# Patient Record
Sex: Male | Born: 1955 | Race: Black or African American | Hispanic: No | Marital: Married | Smoking: Never smoker
Health system: Southern US, Community
[De-identification: ages and names within clinical notes are randomized; demographics above are authoritative.]

## PROBLEM LIST (undated history)

## (undated) DIAGNOSIS — N400 Enlarged prostate without lower urinary tract symptoms: Secondary | ICD-10-CM

## (undated) DIAGNOSIS — M1990 Unspecified osteoarthritis, unspecified site: Secondary | ICD-10-CM

## (undated) DIAGNOSIS — I10 Essential (primary) hypertension: Secondary | ICD-10-CM

## (undated) DIAGNOSIS — M549 Dorsalgia, unspecified: Secondary | ICD-10-CM

## (undated) HISTORY — PX: BACK SURGERY: SHX140

---

## 2001-07-23 ENCOUNTER — Ambulatory Visit (HOSPITAL_COMMUNITY): Admission: RE | Admit: 2001-07-23 | Discharge: 2001-07-23 | Payer: Self-pay | Admitting: Family Medicine

## 2001-07-23 ENCOUNTER — Encounter: Payer: Self-pay | Admitting: Family Medicine

## 2001-07-31 ENCOUNTER — Ambulatory Visit (HOSPITAL_COMMUNITY): Admission: RE | Admit: 2001-07-31 | Discharge: 2001-07-31 | Payer: Self-pay | Admitting: General Surgery

## 2003-01-03 ENCOUNTER — Ambulatory Visit (HOSPITAL_COMMUNITY): Admission: RE | Admit: 2003-01-03 | Discharge: 2003-01-03 | Payer: Self-pay | Admitting: Family Medicine

## 2003-01-03 ENCOUNTER — Encounter: Payer: Self-pay | Admitting: Family Medicine

## 2007-08-26 ENCOUNTER — Ambulatory Visit (HOSPITAL_COMMUNITY): Admission: RE | Admit: 2007-08-26 | Discharge: 2007-08-26 | Payer: Self-pay | Admitting: Family Medicine

## 2010-07-27 NOTE — H&P (Signed)
[facility]  Patient:    OVIE, CORNELIO Visit Number: [id number] MRN: [id number]          Service Type: OUT Location: RAD Attending Physician:  Patrica Duel Dictated by:   Franky Macho, M.D. Admit Date:  07/23/2001 Discharge Date: 07/23/2001   CC:         Patrica Duel, M.D.   History and Physical  DATE OF BIRTH:  05-Aug-1955  CHIEF COMPLAINT:  Hematochezia.  HISTORY OF PRESENT ILLNESS:  The patient is a 55 year old black male who was referred for evaluation and treatment of hematochezia.  He started turning the toilet bowl red with blood per rectum one month ago.  It was intermittent in nature.  No abdominal pain, weight loss, cramping, melena, constipation, or diarrhea had been noted.  PAST MEDICAL HISTORY:  Hypertension, GERD.  PAST SURGICAL HISTORY:  Colonoscopy in 1999 which revealed occasional diverticula, and internal hemorrhoidal disease.  CURRENT MEDICATIONS:  Bisoprolol, hydrochlorothiazide one tablet a day, Skelaxin, Protonix, hydrocortisone cream for his rectum.  ALLERGIES:  No known drug allergies.  REVIEW OF SYSTEMS:  Unremarkable.  PHYSICAL EXAMINATION:  GENERAL:  The patient is a well-developed and well-nourished black male in no acute distress.  VITAL SIGNS:  He is afebrile and vital signs are stable.  LUNGS:  Clear to auscultation with equal breath sounds bilaterally.  HEART:  Examination reveals a regular rate and rhythm without S3, S4, or murmurs.  ABDOMEN:  Benign.  RECTAL:  Examination was deferred until the procedure.  IMPRESSION:  Hematochezia.  PLAN:  The patient was scheduled for a colonoscopy on Jul 31, 2001.  The risks and benefits of the procedure including bleeding and perforation were fully explained to the patient and he gave informed consent. Dictated by:   Franky Macho, M.D. Attending Physician:  Patrica Duel DD:  07/21/01 TD:  07/22/01 Job: [id number]

## 2012-12-15 ENCOUNTER — Ambulatory Visit: Payer: Self-pay | Admitting: Urology

## 2012-12-22 ENCOUNTER — Encounter (INDEPENDENT_AMBULATORY_CARE_PROVIDER_SITE_OTHER): Payer: Self-pay

## 2012-12-22 ENCOUNTER — Ambulatory Visit (INDEPENDENT_AMBULATORY_CARE_PROVIDER_SITE_OTHER): Payer: BC Managed Care – PPO | Admitting: Urology

## 2012-12-22 DIAGNOSIS — R3129 Other microscopic hematuria: Secondary | ICD-10-CM

## 2012-12-22 DIAGNOSIS — N400 Enlarged prostate without lower urinary tract symptoms: Secondary | ICD-10-CM

## 2013-02-16 ENCOUNTER — Encounter (INDEPENDENT_AMBULATORY_CARE_PROVIDER_SITE_OTHER): Payer: Self-pay | Admitting: *Deleted

## 2013-08-20 ENCOUNTER — Encounter (INDEPENDENT_AMBULATORY_CARE_PROVIDER_SITE_OTHER): Payer: Self-pay | Admitting: *Deleted

## 2013-09-20 ENCOUNTER — Encounter (INDEPENDENT_AMBULATORY_CARE_PROVIDER_SITE_OTHER): Payer: Self-pay | Admitting: *Deleted

## 2013-09-20 ENCOUNTER — Other Ambulatory Visit (INDEPENDENT_AMBULATORY_CARE_PROVIDER_SITE_OTHER): Payer: Self-pay | Admitting: *Deleted

## 2013-09-20 DIAGNOSIS — Z1211 Encounter for screening for malignant neoplasm of colon: Secondary | ICD-10-CM

## 2013-10-11 ENCOUNTER — Telehealth (INDEPENDENT_AMBULATORY_CARE_PROVIDER_SITE_OTHER): Payer: Self-pay | Admitting: *Deleted

## 2013-10-11 DIAGNOSIS — Z1211 Encounter for screening for malignant neoplasm of colon: Secondary | ICD-10-CM

## 2013-10-11 NOTE — Telephone Encounter (Signed)
Patient needs movi prep 

## 2013-10-13 MED ORDER — PEG-KCL-NACL-NASULF-NA ASC-C 100 G PO SOLR: 1.0000 | Freq: Once | ORAL | Status: DC

## 2013-11-05 ENCOUNTER — Telehealth (INDEPENDENT_AMBULATORY_CARE_PROVIDER_SITE_OTHER): Payer: Self-pay | Admitting: *Deleted

## 2013-11-05 NOTE — Telephone Encounter (Signed)
  Procedure: tcs  Reason/Indication:  screening  Has patient had this procedure before?  Yes, 12 years ago  If so, when, by whom and where?    Is there a family history of colon cancer?  no  Who?  What age when diagnosed?    Is patient diabetic?   no      Does patient have prosthetic heart valve?  no  Do you have a pacemaker?  nono  Has patient ever had endocarditis? no  Has patient had joint replacement within last 12 months?  no  Does patient tend to be constipated or take laxatives? no  Is patient on Coumadin, Plavix and/or Aspirin? no  Medications: lisinopril/hctz 20/12.5 mg daily  Allergies: nkda  Medication Adjustment:   Procedure date & time: 12/02/13 at 1030

## 2013-11-05 NOTE — Telephone Encounter (Signed)
agree

## 2013-11-16 ENCOUNTER — Encounter (HOSPITAL_COMMUNITY): Payer: Self-pay | Admitting: Pharmacy Technician

## 2013-12-02 ENCOUNTER — Encounter (HOSPITAL_COMMUNITY): Payer: Self-pay | Admitting: *Deleted

## 2013-12-02 ENCOUNTER — Encounter (HOSPITAL_COMMUNITY)
Admission: RE | Disposition: A | Discharge status: Home / Self Care | Payer: Self-pay | Source: Ambulatory Visit | Attending: Internal Medicine

## 2013-12-02 ENCOUNTER — Ambulatory Visit (HOSPITAL_COMMUNITY)
Admission: RE | Admit: 2013-12-02 | Discharge: 2013-12-02 | Disposition: A | Discharge status: Home / Self Care | Payer: BC Managed Care – PPO | Source: Ambulatory Visit | Attending: Internal Medicine | Admitting: Internal Medicine

## 2013-12-02 DIAGNOSIS — K644 Residual hemorrhoidal skin tags: Secondary | ICD-10-CM | POA: Insufficient documentation

## 2013-12-02 DIAGNOSIS — Z79899 Other long term (current) drug therapy: Secondary | ICD-10-CM | POA: Insufficient documentation

## 2013-12-02 DIAGNOSIS — K5730 Diverticulosis of large intestine without perforation or abscess without bleeding: Secondary | ICD-10-CM | POA: Diagnosis not present

## 2013-12-02 DIAGNOSIS — Z1211 Encounter for screening for malignant neoplasm of colon: Secondary | ICD-10-CM | POA: Diagnosis present

## 2013-12-02 DIAGNOSIS — I10 Essential (primary) hypertension: Secondary | ICD-10-CM | POA: Diagnosis not present

## 2013-12-02 DIAGNOSIS — D126 Benign neoplasm of colon, unspecified: Secondary | ICD-10-CM | POA: Diagnosis not present

## 2013-12-02 DIAGNOSIS — K645 Perianal venous thrombosis: Secondary | ICD-10-CM | POA: Diagnosis not present

## 2013-12-02 HISTORY — DX: Unspecified osteoarthritis, unspecified site: M19.90

## 2013-12-02 HISTORY — DX: Benign prostatic hyperplasia without lower urinary tract symptoms: N40.0

## 2013-12-02 HISTORY — DX: Dorsalgia, unspecified: M54.9

## 2013-12-02 HISTORY — DX: Essential (primary) hypertension: I10

## 2013-12-02 HISTORY — PX: COLONOSCOPY: SHX5424

## 2013-12-02 SURGERY — COLONOSCOPY
Anesthesia: Moderate Sedation

## 2013-12-02 MED ORDER — SODIUM CHLORIDE 0.9 % IV SOLN
INTRAVENOUS | Status: DC
  Administered 2013-12-02: 10:00:00 via INTRAVENOUS

## 2013-12-02 MED ORDER — MIDAZOLAM HCL 5 MG/5ML IJ SOLN
INTRAMUSCULAR | Status: DC | PRN
  Administered 2013-12-02 (×2): 2 mg via INTRAVENOUS

## 2013-12-02 MED ORDER — MEPERIDINE HCL 50 MG/ML IJ SOLN
INTRAMUSCULAR | Status: AC
  Filled 2013-12-02: qty 1

## 2013-12-02 MED ORDER — STERILE WATER FOR IRRIGATION IR SOLN
Status: DC | PRN
  Administered 2013-12-02: 11:00:00

## 2013-12-02 MED ORDER — MIDAZOLAM HCL 5 MG/5ML IJ SOLN
INTRAMUSCULAR | Status: AC
  Filled 2013-12-02: qty 10

## 2013-12-02 MED ORDER — MEPERIDINE HCL 50 MG/ML IJ SOLN
INTRAMUSCULAR | Status: DC | PRN
  Administered 2013-12-02 (×2): 25 mg via INTRAVENOUS

## 2013-12-02 NOTE — Op Note (Signed)
[place], [address]   COLONOSCOPY PROCEDURE REPORT  PATIENT: Ethan, Bentley  MR#: [id number] BIRTHDATE: 1956/03/06 , 31  yrs. old GENDER: male ENDOSCOPIST: Hildred Laser, MD REFERRED BY:  Kerin Perna, M.D. PROCEDURE DATE:  12/02/2013 PROCEDURE:   Colonoscopy, screening ASA CLASS:   Class I INDICATIONS:average risk for colorectal cancer. MEDICATIONS: Cetacaine spray for oral pharyngeal topical anesthesia, Meperidine (Demerol) 50 mg IV, and Versed 4 mg IV  DESCRIPTION OF PROCEDURE:   After the risks and benefits and of the procedure were explained, informed consent was obtained.  revealed a normal rectal and perianal exam.    The [id number] ([id number]) endoscope was introduced through the anus and advanced to the cecum, which was identified by both the appendix and ileocecal valve .  The quality of the prep was adequate. .  The instrument was then slowly withdrawn as the colon was fully examined.     COLON FINDINGS: There was mild diverticulosis noted at the hepatic flexure and in the sigmoid colon.   Two polyps ranging between 3-81mm in size were found in the right colon and at the cecum. Multiple biopsies were performed.  A polypectomy was performed with a cold snare.  The resection was complete, the polyp tissue was completely retrieved and sent to histology.     Retroflexed views revealed internal thrombosed hemorrhoids.     The scope was then withdrawn from the patient and the procedure completed.  WITHDRAWAL TIME: 16 minutes 0 seconds  COMPLICATIONS: There were no complications. ENDOSCOPIC IMPRESSION: 1.   There was mild diverticulosis noted at the hepatic flexure and in the sigmoid colon 2.   Two polyps ranging between 3-42mm in size were found in the right colon and at the cecum; multiple biopsies were performed.these polyps were submitted together. 3.   5 mm polyp cold snare from sigmoid colon. 4.   external  hemorrhoids. RECOMMENDATIONS: Await biopsy results  REPEAT EXAM:  cc:  _______________________________ Lorrin MaisHildred Laser, MD 12/02/2013 11:38 AM     PATIENT NAME:  Ethan, Bentley MR#: [id number]

## 2013-12-02 NOTE — Discharge Instructions (Addendum)
Resume usual medications and high fiber diet. °No driving for 24 hours. °Physician will call with biopsy results. ° °Colonoscopy, Care After °Refer to this sheet in the next few weeks. These instructions provide you with information on caring for yourself after your procedure. Your health care provider may also give you more specific instructions. Your treatment has been planned according to current medical practices, but problems sometimes occur. Call your health care provider if you have any problems or questions after your procedure. °WHAT TO EXPECT AFTER THE PROCEDURE  °After your procedure, it is typical to have the following: °· A small amount of blood in your stool. °· Moderate amounts of gas and mild abdominal cramping or bloating. °HOME CARE INSTRUCTIONS °· Do not drive, operate machinery, or sign important documents for 24 hours. °· You may shower and resume your regular physical activities, but move at a slower pace for the first 24 hours. °· Take frequent rest periods for the first 24 hours. °· Walk around or put a warm pack on your abdomen to help reduce abdominal cramping and bloating. °· Drink enough fluids to keep your urine clear or pale yellow. °· You may resume your normal diet as instructed by your health care provider. Avoid heavy or fried foods that are hard to digest. °· Avoid drinking alcohol for 24 hours or as instructed by your health care provider. °· Only take over-the-counter or prescription medicines as directed by your health care provider. °· If a tissue sample (biopsy) was taken during your procedure: °· Do not take aspirin or blood thinners for 7 days, or as instructed by your health care provider. °· Do not drink alcohol for 7 days, or as instructed by your health care provider. °· Eat soft foods for the first 24 hours. °SEEK MEDICAL CARE IF: °You have persistent spotting of blood in your stool 2-3 days after the procedure. °SEEK IMMEDIATE MEDICAL CARE IF: °· You have more than a  small spotting of blood in your stool. °· You pass large blood clots in your stool. °· Your abdomen is swollen (distended). °· You have nausea or vomiting. °· You have a fever. °· You have increasing abdominal pain that is not relieved with medicine. °Document Released: 10/10/2003 Document Revised: 12/16/2012 Document Reviewed: 11/02/2012 °ExitCare® Patient Information ©2015 [employer]. This information is not intended to replace advice given to you by your health care provider. Make sure you discuss any questions you have with your health care provider. ° °High-Fiber Diet °Fiber is found in fruits, vegetables, and grains. A high-fiber diet encourages the addition of more whole grains, legumes, fruits, and vegetables in your diet. The recommended amount of fiber for adult males is 38 g per day. For adult females, it is 25 g per day. Pregnant and lactating women should get 28 g of fiber per day. If you have a digestive or bowel problem, ask your caregiver for advice before adding high-fiber foods to your diet. Eat a variety of high-fiber foods instead of only a select few type of foods.  °PURPOSE °· To increase stool bulk. °· To make bowel movements more regular to prevent constipation. °· To lower cholesterol. °· To prevent overeating. °WHEN IS THIS DIET USED? °· It may be used if you have constipation and hemorrhoids. °· It may be used if you have uncomplicated diverticulosis (intestine condition) and irritable bowel syndrome. °· It may be used if you need help with weight management. °· It may be used if you want to   add it to your diet as a protective measure against atherosclerosis, diabetes, and cancer. °SOURCES OF FIBER °· Whole-grain breads and cereals. °· Fruits, such as apples, oranges, bananas, berries, prunes, and pears. °· Vegetables, such as green peas, carrots, sweet potatoes, beets, broccoli, cabbage, spinach, and artichokes. °· Legumes, such split peas, soy, lentils. °· Almonds. °FIBER CONTENT IN  FOODS °Starches and Grains / Dietary Fiber (g) °· Cheerios, 1 cup / 3 g °· Corn Flakes cereal, 1 cup / 0.7 g °· Rice crispy treat cereal, 1¼ cup / 0.3 g °· Instant oatmeal (cooked), ½ cup / 2 g °· Frosted wheat cereal, 1 cup / 5.1 g °· Brown, long-grain rice (cooked), 1 cup / 3.5 g °· White, long-grain rice (cooked), 1 cup / 0.6 g °· Enriched macaroni (cooked), 1 cup / 2.5 g °Legumes / Dietary Fiber (g) °· Baked beans (canned, plain, or vegetarian), ½ cup / 5.2 g °· Kidney beans (canned), ½ cup / 6.8 g °· Pinto beans (cooked), ½ cup / 5.5 g °Breads and Crackers / Dietary Fiber (g) °· Plain or honey graham crackers, 2 squares / 0.7 g °· Saltine crackers, 3 squares / 0.3 g °· Plain, salted pretzels, 10 pieces / 1.8 g °· Whole-wheat bread, 1 slice / 1.9 g °· White bread, 1 slice / 0.7 g °· Raisin bread, 1 slice / 1.2 g °· Plain bagel, 3 oz / 2 g °· Flour tortilla, 1 oz / 0.9 g °· Corn tortilla, 1 small / 1.5 g °· Hamburger or hotdog bun, 1 small / 0.9 g °Fruits / Dietary Fiber (g) °· Apple with skin, 1 medium / 4.4 g °· Sweetened applesauce, ½ cup / 1.5 g °· Banana, ½ medium / 1.5 g °· Grapes, 10 grapes / 0.4 g °· Orange, 1 small / 2.3 g °· Raisin, 1.5 oz / 1.6 g °· Melon, 1 cup / 1.4 g °Vegetables / Dietary Fiber (g) °· Green beans (canned), ½ cup / 1.3 g °· Carrots (cooked), ½ cup / 2.3 g °· Broccoli (cooked), ½ cup / 2.8 g °· Peas (cooked), ½ cup / 4.4 g °· Mashed potatoes, ½ cup / 1.6 g °· Lettuce, 1 cup / 0.5 g °· Corn (canned), ½ cup / 1.6 g °· Tomato, ½ cup / 1.1 g °Document Released: 02/25/2005 Document Revised: 08/27/2011 Document Reviewed: 05/30/2011 °ExitCare® Patient Information ©2015 [employer]. This information is not intended to replace advice given to you by your health care provider. Make sure you discuss any questions you have with your health care provider. ° °

## 2013-12-02 NOTE — H&P (Signed)
Ethan Bentley is an 58 y.o. male.   Chief Complaint: Patient is here for colonoscopy. HPI: Patient is 58 year old [place] American male who is here for screening colonoscopy. He denies abdominal pain change in his bowel habits or rectal bleeding. Last colonoscopy was 5 years ago.  Family history is negative for [facility]  Past Medical History  Diagnosis Date  . Hypertension   . Arthritis   . Prostate enlargement   . Back pain     History reviewed. No pertinent past surgical history.  Family History  Problem Relation Age of Onset  . Cancer Mother   . Stroke Father    Social History:  reports that he has never smoked. He has never used smokeless tobacco. He reports that he drinks about 1.8 ounces of alcohol per week. He reports that he does not use illicit drugs.  Allergies: No Known Allergies  Medications Prior to Admission  Medication Sig Dispense Refill  . cholecalciferol (VITAMIN D) 1000 UNITS tablet Take 1,000 Units by mouth daily.      Marland Kitchen lisinopril-hydrochlorothiazide (PRINZIDE,ZESTORETIC) 20-12.5 MG per tablet Take 1 tablet by mouth daily.      . Omega-3 Fatty Acids (FISH OIL) 1000 MG CAPS Take 1 capsule by mouth daily.      . peg 3350 powder (MOVIPREP) 100 G SOLR Take 1 kit (200 g total) by mouth once.  1 kit  0  . vitamin E 1000 UNIT capsule Take 1,000 Units by mouth daily.        No results found for this or any previous visit (from the past 48 hour(s)). No results found.  ROS  Blood pressure 121/81, pulse 56, temperature 97.7 F (36.5 C), temperature source Oral, resp. rate 16, SpO2 100.00%. Physical Exam  Constitutional: He appears well-developed and well-nourished.  HENT:  Mouth/Throat: Oropharynx is clear and moist.  Eyes: Conjunctivae are normal. No scleral icterus.  Neck: No thyromegaly present.  Cardiovascular: Normal rate, regular rhythm and normal heart sounds.   No murmur heard. Respiratory: Effort normal and breath sounds normal.  GI: Soft. He  exhibits no distension and no mass. There is no tenderness.  Musculoskeletal: He exhibits no edema.  Lymphadenopathy:    He has no cervical adenopathy.  Neurological: He is alert.  Skin: Skin is warm and dry.     Assessment/Plan Average risk screening colonoscopy.  REHMAN,NAJEEB U 12/02/2013, 10:54 AM

## 2013-12-03 ENCOUNTER — Encounter (HOSPITAL_COMMUNITY): Payer: Self-pay | Admitting: Internal Medicine

## 2014-04-12 ENCOUNTER — Emergency Department (HOSPITAL_COMMUNITY): Payer: BLUE CROSS/BLUE SHIELD

## 2014-04-12 ENCOUNTER — Other Ambulatory Visit (HOSPITAL_COMMUNITY): Payer: Self-pay | Admitting: Family Medicine

## 2014-04-12 ENCOUNTER — Encounter (HOSPITAL_COMMUNITY): Payer: Self-pay | Admitting: Emergency Medicine

## 2014-04-12 ENCOUNTER — Emergency Department (HOSPITAL_COMMUNITY)
Admission: EM | Admit: 2014-04-12 | Discharge: 2014-04-12 | Disposition: A | Discharge status: Home / Self Care | Payer: BLUE CROSS/BLUE SHIELD | Attending: Emergency Medicine | Admitting: Emergency Medicine

## 2014-04-12 DIAGNOSIS — M5416 Radiculopathy, lumbar region: Secondary | ICD-10-CM | POA: Diagnosis not present

## 2014-04-12 DIAGNOSIS — I10 Essential (primary) hypertension: Secondary | ICD-10-CM | POA: Insufficient documentation

## 2014-04-12 DIAGNOSIS — M1990 Unspecified osteoarthritis, unspecified site: Secondary | ICD-10-CM | POA: Diagnosis not present

## 2014-04-12 DIAGNOSIS — Z79899 Other long term (current) drug therapy: Secondary | ICD-10-CM | POA: Insufficient documentation

## 2014-04-12 DIAGNOSIS — Z87448 Personal history of other diseases of urinary system: Secondary | ICD-10-CM | POA: Insufficient documentation

## 2014-04-12 DIAGNOSIS — M79605 Pain in left leg: Secondary | ICD-10-CM | POA: Diagnosis present

## 2014-04-12 MED ORDER — OXYCODONE-ACETAMINOPHEN 5-325 MG PO TABS: 1.0000 | ORAL_TABLET | ORAL | Status: DC | PRN

## 2014-04-12 MED ORDER — HYDROMORPHONE HCL 1 MG/ML IJ SOLN
1.0000 mg | Freq: Once | INTRAMUSCULAR | Status: AC
  Administered 2014-04-12: 1 mg via INTRAMUSCULAR
  Filled 2014-04-12: qty 1

## 2014-04-12 MED ORDER — HYDROMORPHONE HCL 1 MG/ML IJ SOLN
1.0000 mg | Freq: Once | INTRAMUSCULAR | Status: AC
Start: 2014-04-12 — End: 2014-04-12
  Administered 2014-04-12: 1 mg via INTRAMUSCULAR
  Filled 2014-04-12: qty 1

## 2014-04-12 NOTE — ED Notes (Addendum)
Patient called stating he spoke with MD about a refill prescription for Lisinopril-HCTZ 20-12.5 mg. MD consulted and verbal order to call in prescription for Lisinopril-HCTZ 20-12.5 mg Tab OTY:30 with 2 refills. RN Called prescription to [facility] in [place] per patient request.

## 2014-04-12 NOTE — ED Notes (Signed)
Patient with no complaints at this time. Respirations even and unlabored. Skin warm/dry. Discharge instructions reviewed with patient at this time. Patient given opportunity to voice concerns/ask questions. Patient discharged at this time and left Emergency Department with steady gait.   

## 2014-04-12 NOTE — ED Provider Notes (Signed)
CSN: [id number]     Arrival date & time 04/12/14  1008 History  This chart was scribed for Nat Christen, MD by Lowella Petties, ED Scribe. The patient was seen in room APA15/APA15. Patient's care was started at 10:44 AM.     Chief Complaint  Patient presents with  . Leg Pain   The history is provided by the patient. No language interpreter was used.   HPI Comments: BUCK MCAFFEE is a 59 y.o. male with a history of HTN who presents to the Emergency Department complaining of constant, aching, gradually worsening, pain in his left lower back that radiates down his medial and lateral thigh and into his anterior left shin and left foot for the past 10 days. He states that the pain began after he shoveled snow on 04/02/14. He reports difficulty standing and walking due to the pain. He reports taking ibuprofen with minimal relief. He was seen for this at a primary care facility and prescribed a steroid dose pack. He reports that he had an MRI of his back in the past which showed some degenerative changes. He denies a history of back surgery.   Past Medical History  Diagnosis Date  . Hypertension   . Arthritis   . Prostate enlargement   . Back pain    Past Surgical History  Procedure Laterality Date  . Colonoscopy N/A 12/02/2013    Procedure: COLONOSCOPY;  Surgeon: Rogene Houston, MD;  Location: [facility];  Service: Endoscopy;  Laterality: N/A;  1030   Family History  Problem Relation Age of Onset  . Cancer Mother   . Stroke Father    History  Substance Use Topics  . Smoking status: Never Smoker   . Smokeless tobacco: Never Used  . Alcohol Use: 1.8 oz/week    3 Cans of beer per week    Review of Systems  Constitutional: Negative for fever and chills.  Musculoskeletal: Positive for back pain.   A complete 10 system review of systems was obtained and all systems are negative except as noted in the HPI and PMH.   Allergies  Review of patient's allergies indicates no known  allergies.  Home Medications   Prior to Admission medications   Medication Sig Start Date End Date Taking? Authorizing Provider  ibuprofen (ADVIL,MOTRIN) 800 MG tablet Take 800 mg by mouth 3 (three) times daily. 04/08/14  Yes Historical Provider, MD  lisinopril-hydrochlorothiazide (PRINZIDE,ZESTORETIC) 20-12.5 MG per tablet Take 1 tablet by mouth daily.   Yes Historical Provider, MD  Omega-3 Fatty Acids (FISH OIL) 1000 MG CAPS Take 1 capsule by mouth daily.   Yes Historical Provider, MD  predniSONE (STERAPRED UNI-PAK) 10 MG tablet Take 1 tablet by mouth as directed. 04/08/14  Yes Historical Provider, MD  vitamin E 1000 UNIT capsule Take 1,000 Units by mouth daily.   Yes Historical Provider, MD  oxyCODONE-acetaminophen (PERCOCET) 5-325 MG per tablet Take 1-2 tablets by mouth every 4 (four) hours as needed. 04/12/14   Nat Christen, MD   Triage Vitals: BP 160/93 mmHg  Pulse 62  Temp(Src) 98 F (36.7 C) (Oral)  Resp 18  Ht 5\' 6"  (1.676 m)  Wt 185 lb (83.915 kg)  BMI 29.87 kg/m2  SpO2 98% Physical Exam  Constitutional: He is oriented to person, place, and time. He appears well-developed and well-nourished.  HENT:  Head: Normocephalic and atraumatic.  Eyes: Conjunctivae and EOM are normal. Pupils are equal, round, and reactive to light.  Neck: Normal range of motion.  Neck supple.  Cardiovascular: Normal rate, regular rhythm and normal heart sounds.   No murmur heard. Pulmonary/Chest: Effort normal and breath sounds normal. No respiratory distress. He has no wheezes. He has no rales. He exhibits no tenderness.  Abdominal: Soft. Bowel sounds are normal.  Musculoskeletal: Normal range of motion.  Lower lumbar spinal tenderness. Medial and lateral thigh and anterior tibia.   Neurological: He is alert and oriented to person, place, and time.  Skin: Skin is warm and dry.  Psychiatric: He has a normal mood and affect. His behavior is normal.  Nursing note and vitals reviewed.   ED Course   Procedures (including critical care time) DIAGNOSTIC STUDIES: Oxygen Saturation is 98% on room air, normal by my interpretation.    COORDINATION OF CARE: 10:51 AM-Discussed treatment plan which includes pain medication and lumbar spine MRI with pt at bedside and pt agreed to plan.   No results found for this or any previous visit. Mr Lumbar Spine Wo Contrast  04/12/2014   CLINICAL DATA:  Severe low back and left leg pain since the shoveling snow 1 week ago.  EXAM: MRI LUMBAR SPINE WITHOUT CONTRAST  TECHNIQUE: Multiplanar, multisequence MR imaging of the lumbar spine was performed. No intravenous contrast was administered.  COMPARISON:  None.  FINDINGS: Vertebral body height is maintained. There is trace anterolisthesis L4 on L5 due to facet arthropathy. Degenerative endplate signal change is seen at L5-S1. Is no worrisome marrow lesion. Convex right scoliosis is noted. The conus medullaris is normal in signal and position. Imaged intra-abdominal contents are unremarkable.  T11-12 level is imaged in the sagittal plane only and negative.  T12-L1:  Negative.  L1-2: There is a shallow disc bulge without central canal or foraminal stenosis.  L2-3: The patient has a shallow disc bulge with a superimposed left paracentral protrusion. The protrusion encroaches on the descending left L3 root and deforms left aspect of the thecal sac. Marked left foraminal narrowing is present due to disc and facet arthropathy. The right foramen appears open.  L3-4: The patient has a broad-based disc bulge with a superimposed left foraminal and extra foraminal protrusion. Moderate central canal stenosis is present with narrowing in the lateral recesses, worse on the left. Marked left foraminal narrowing is identified. The right foramen is open  L4-5: The patient has a disc bulge with a superimposed right paracentral and lateral recess protrusion. There is encroachment on the descending right L5 root in the lateral recess. Moderately  severe to severe bilateral foraminal narrowing is also present and worse on the right.  L5-S1: There is an annular fissure and shallow left paracentral protrusion. The thecal sac is widely patent. Mild narrowing is seen in the left lateral recess. Moderate to moderately severe foraminal narrowing is worse on the left.  IMPRESSION: Left paracentral protrusion at L2-3 encroaches on the descending left L3 root in the lateral recess and deforms the left aspect of the thecal sac. There is also marked left foraminal narrowing at this level due to disc and facet degenerative disease with encroachment on the exiting left L2 root.  Severe left foraminal narrowing L3-4 due to disc and facet arthropathy with encroachment on the exiting left L3 root. There is moderate central canal narrowing overall at this level.  A right lateral recess protrusion at L4-5 appears small but encroaches on the descending right L5 root. Moderate to moderately severe bilateral foraminal narrowing is also present at this level.  Disc and facet degenerative disease cause moderate to moderately  severe foraminal narrowing at L5-S1, worse on the left   Electronically Signed   By: Inge Rise M.D.   On: 04/12/2014 12:42      EKG Interpretation None      MDM   Final diagnoses:  Lumbar back pain with radiculopathy affecting left lower extremity   Patient presents with low back pain with radicular symptoms to the left. MRI scan shows left herniated nucleus pulposus at L2-3 encroaching on the nerve root. Discussed with Dr. Granville Lewis.  He will see patient on Thursday, February 4. Continue prednisone. Add Percocet for pain. Discussed with patient and his nephew.  I personally performed the services described in this documentation, which was scribed in my presence. The recorded information has been reviewed and is accurate.     Nat Christen, MD 04/12/14 [phone]

## 2014-04-12 NOTE — ED Notes (Addendum)
PT stated he was shoveling snow on 04/02/14 and started having lower back pain and went to the MD on 04/08/14 was prescribed prednisone, pain medicine and ibuprofen that day for sciatic nerve pain. PT stated pain was relived until he went back to work yesterday and stated aching again. PT denies any urinary symptoms.

## 2014-04-12 NOTE — Discharge Instructions (Signed)
Continue prednisone. Prescription for pain medication. Alternate cold and heat. Appointment with neurosurgeon on Thursday, February 4 11:30. Information given and discharge instructions. He will need to bring a photo ID, your insurance card, a co-pay. If you have any questions call [phone]

## 2014-07-28 ENCOUNTER — Emergency Department (HOSPITAL_COMMUNITY)
Admission: EM | Admit: 2014-07-28 | Discharge: 2014-07-29 | Disposition: A | Discharge status: Home / Self Care | Payer: BLUE CROSS/BLUE SHIELD | Attending: Emergency Medicine | Admitting: Emergency Medicine

## 2014-07-28 ENCOUNTER — Encounter (HOSPITAL_COMMUNITY): Payer: Self-pay | Admitting: *Deleted

## 2014-07-28 ENCOUNTER — Emergency Department (HOSPITAL_COMMUNITY): Payer: BLUE CROSS/BLUE SHIELD

## 2014-07-28 DIAGNOSIS — I10 Essential (primary) hypertension: Secondary | ICD-10-CM | POA: Diagnosis not present

## 2014-07-28 DIAGNOSIS — R319 Hematuria, unspecified: Secondary | ICD-10-CM

## 2014-07-28 DIAGNOSIS — Z87448 Personal history of other diseases of urinary system: Secondary | ICD-10-CM | POA: Diagnosis not present

## 2014-07-28 DIAGNOSIS — Z79899 Other long term (current) drug therapy: Secondary | ICD-10-CM | POA: Insufficient documentation

## 2014-07-28 DIAGNOSIS — M1990 Unspecified osteoarthritis, unspecified site: Secondary | ICD-10-CM | POA: Diagnosis not present

## 2014-07-28 LAB — CBC WITH DIFFERENTIAL/PLATELET
BASOS ABS: 0 10*3/uL (ref 0.0–0.1)
Basophils Relative: 0 % (ref 0–1)
Eosinophils Absolute: 0.2 10*3/uL (ref 0.0–0.7)
Eosinophils Relative: 4 % (ref 0–5)
HCT: 47.5 % (ref 39.0–52.0)
Hemoglobin: 15.9 g/dL (ref 13.0–17.0)
LYMPHS PCT: 55 % — AB (ref 12–46)
Lymphs Abs: 3.7 10*3/uL (ref 0.7–4.0)
MCH: 32.1 pg (ref 26.0–34.0)
MCHC: 33.5 g/dL (ref 30.0–36.0)
MCV: 95.8 fL (ref 78.0–100.0)
Monocytes Absolute: 0.6 10*3/uL (ref 0.1–1.0)
Monocytes Relative: 9 % (ref 3–12)
Neutro Abs: 2.2 10*3/uL (ref 1.7–7.7)
Neutrophils Relative %: 32 % — ABNORMAL LOW (ref 43–77)
PLATELETS: 147 10*3/uL — AB (ref 150–400)
RBC: 4.96 MIL/uL (ref 4.22–5.81)
RDW: 13 % (ref 11.5–15.5)
WBC: 6.7 10*3/uL (ref 4.0–10.5)

## 2014-07-28 LAB — URINALYSIS, ROUTINE W REFLEX MICROSCOPIC
Glucose, UA: NEGATIVE mg/dL
Ketones, ur: NEGATIVE mg/dL
Leukocytes, UA: NEGATIVE
Nitrite: NEGATIVE
PH: 5.5 (ref 5.0–8.0)
Protein, ur: 30 mg/dL — AB
UROBILINOGEN UA: 0.2 mg/dL (ref 0.0–1.0)

## 2014-07-28 LAB — COMPREHENSIVE METABOLIC PANEL
ALT: 43 U/L (ref 17–63)
AST: 27 U/L (ref 15–41)
Albumin: 4.4 g/dL (ref 3.5–5.0)
Alkaline Phosphatase: 69 U/L (ref 38–126)
Anion gap: 7 (ref 5–15)
BILIRUBIN TOTAL: 0.4 mg/dL (ref 0.3–1.2)
BUN: 16 mg/dL (ref 6–20)
CALCIUM: 9.2 mg/dL (ref 8.9–10.3)
CO2: 28 mmol/L (ref 22–32)
Chloride: 103 mmol/L (ref 101–111)
Creatinine, Ser: 0.95 mg/dL (ref 0.61–1.24)
GFR calc Af Amer: >60 mL/min (ref >60)
GLUCOSE: 93 mg/dL (ref 65–99)
POTASSIUM: 3.6 mmol/L (ref 3.5–5.1)
SODIUM: 138 mmol/L (ref 135–145)
Total Protein: 7.5 g/dL (ref 6.5–8.1)

## 2014-07-28 LAB — URINE MICROSCOPIC-ADD ON

## 2014-07-28 NOTE — ED Notes (Signed)
Hematuria since yesterday

## 2014-07-28 NOTE — ED Provider Notes (Signed)
CSN: [id number]     Arrival date & time 07/28/14  1806 History   First MD Initiated Contact with Patient 07/28/14 2257     This chart was scribed for Ethan Hacker, MD by Ethan Bentley, ED Scribe. This patient was seen in room APA09/APA09 and the patient's care was started 11:16 PM.   Chief Complaint  Patient presents with  . Hematuria   The history is provided by the patient. No language interpreter was used.    HPI Comments: Ethan Bentley is a 59 y.o. male with a PMHx of HTN and prostate enlargement who presents to the Emergency Department complaining of constant, ongoing hematuria x 1 day. Pt also reports ongoing fatigue and intermittentL sided flank pain. No recent abdominal pain, fever, chills, or dysuria. Denies pain at this time. [name] denies any previous symptoms in the past. He is an occasional drinker but has never been a smoker. No personal history of kidney stones. PSHx includes back surgery in last few weeks. No known allergies to medications.  Past Medical History  Diagnosis Date  . Hypertension   . Arthritis   . Prostate enlargement   . Back pain    Past Surgical History  Procedure Laterality Date  . Colonoscopy N/A 12/02/2013    Procedure: COLONOSCOPY;  Surgeon: Ethan Houston, MD;  Location: [facility];  Service: Endoscopy;  Laterality: N/A;  1030  . Back surgery     Family History  Problem Relation Age of Onset  . Cancer Mother   . Stroke Father    History  Substance Use Topics  . Smoking status: Never Smoker   . Smokeless tobacco: Never Used  . Alcohol Use: 1.8 oz/week    3 Cans of beer per week    Review of Systems  Constitutional: Positive for fatigue. Negative for fever and chills.  Respiratory: Negative.  Negative for chest tightness and shortness of breath.   Cardiovascular: Negative.  Negative for chest pain.  Gastrointestinal: Negative.  Negative for nausea, vomiting, abdominal pain and diarrhea.  Genitourinary: Positive for  hematuria and flank pain. Negative for dysuria.  Musculoskeletal: Negative for back pain.  Skin: Negative for rash.  Neurological: Negative for headaches.  All other systems reviewed and are negative.     Allergies  Review of patient's allergies indicates no known allergies.  Home Medications   Prior to Admission medications   Medication Sig Start Date End Date Taking? Authorizing Provider  Ethan Bentley, Camillia sinensis, (GREEN Bentley PO) Take 1 tablet by mouth daily.   Yes Historical Provider, MD  ibuprofen (ADVIL,MOTRIN) 200 MG tablet Take 400-600 mg by mouth every 6 (six) hours as needed for mild pain or moderate pain.   Yes Historical Provider, MD  KRILL OIL PO Take 1 capsule by mouth daily.   Yes Historical Provider, MD  lisinopril-hydrochlorothiazide (PRINZIDE,ZESTORETIC) 20-12.5 MG per tablet Take 1 tablet by mouth daily.   Yes Historical Provider, MD  oxyCODONE-acetaminophen (PERCOCET) 5-325 MG per tablet Take 1-2 tablets by mouth every 4 (four) hours as needed. Patient not taking: Reported on 07/28/2014 04/12/14   Ethan Christen, MD   Triage Vitals: BP 130/87 mmHg  Pulse 58  Temp(Src) 98.2 F (36.8 C) (Oral)  Resp 20  Ht 5\' 6"  (1.676 m)  Wt 189 lb (85.73 kg)  BMI 30.52 kg/m2  SpO2 99%   Physical Exam  Constitutional: He is oriented to person, place, and time. He appears well-developed and well-nourished.  Appears younger than stated age  HENT:  Head: Normocephalic and atraumatic.  Eyes: Pupils are equal, round, and reactive to light.  Cardiovascular: Normal rate, regular rhythm and normal heart sounds.   No murmur heard. Pulmonary/Chest: Effort normal and breath sounds normal. No respiratory distress. He has no wheezes.  Abdominal: Soft. Bowel sounds are normal. There is no tenderness. There is no rebound and no guarding.  Musculoskeletal: He exhibits no edema.  Neurological: He is alert and oriented to person, place, and time.  Skin: Skin is warm and dry.  Psychiatric: He  has a normal mood and affect.  Nursing note and vitals reviewed.   ED Course  Procedures (including critical care time)  DIAGNOSTIC STUDIES: Oxygen Saturation is 96% on RA, adequate by my interpretation.    COORDINATION OF CARE: 11:19 PM- Will order CBC, CMP, urinalysis, and urine culture. Discussed treatment plan with pt at bedside and pt agreed to plan.     Labs Review Labs Reviewed  CBC WITH DIFFERENTIAL/PLATELET - Abnormal; Notable for the following:    Platelets 147 (*)    Neutrophils Relative % 32 (*)    Lymphocytes Relative 55 (*)    All other components within normal limits  URINALYSIS, ROUTINE W REFLEX MICROSCOPIC - Abnormal; Notable for the following:    Color, Urine AMBER (*)    APPearance HAZY (*)    Specific Gravity, Urine >1.030 (*)    Hgb urine dipstick LARGE (*)    Bilirubin Urine SMALL (*)    Protein, ur 30 (*)    All other components within normal limits  URINE MICROSCOPIC-ADD ON - Abnormal; Notable for the following:    Bacteria, UA FEW (*)    Crystals CA OXALATE CRYSTALS (*)    All other components within normal limits  COMPREHENSIVE METABOLIC PANEL    Imaging Review Ct Renal Stone Study  07/29/2014   CLINICAL DATA:  Hematuria and left flank pain for 1 day.  EXAM: CT ABDOMEN AND PELVIS WITHOUT CONTRAST  TECHNIQUE: Multidetector CT imaging of the abdomen and pelvis was performed following the standard protocol without IV contrast.  COMPARISON:  None.  FINDINGS: The included lung bases are clear.  The kidneys are symmetric in size without stones or hydronephrosis. There is no perinephric stranding. Both ureters are decompressed without stones along the course. The unenhanced liver, gallbladder, spleen, pancreas, and adrenal glands are normal.  Small hiatal hernia. Stomach is physiologically distended. There are no dilated or thickened bowel loops. The appendix is normal. Moderate stool throughout the colon with scattered colonic diverticula, no diverticulitis.  No colonic wall thickening. No free air, free fluid, or intra-abdominal fluid collection.  No retroperitoneal adenopathy. Abdominal aorta is normal in caliber. Small fat containing umbilical hernia.  Within the pelvis the urinary bladder is near completely decompressed, no bladder stone. Prostate gland is enlarged measuring 6 cm in transverse dimension. There is no pelvic free fluid. No pelvic adenopathy.  There is degenerative change in the lumbar spine. Left hemi laminectomy change at L3 with adjacent postsurgical change in the paraspinal soft tissues. No fluid collection.  IMPRESSION: 1.  No renal stones or obstructive uropathy. 2. Mild colonic diverticulosis without diverticulitis. Prostatomegaly.   Electronically Signed   By: Jeb Levering M.D.   On: 07/29/2014 01:21     EKG Interpretation None      MDM   Final diagnoses:  Hematuria    Patient presents with hematuria. Currently painless hematuria. Does report intermittent left flank pain over the last several weeks. Urinalysis without evidence  of urinary tract infection. Does have gross blood with too numerous to count red cells and calcium oxalate crystals. Patient is very comfortable appearing.   Hematuria could be secondary to a recently passed kidney stone. Otherwise given no evidence of urinary tract infection, patient would need workup for painless hematuria. CT scan obtained to evaluate for kidney stones which is negative. Discussed with patient follow-up with primary physician and urology for further workup for painless hematuria.  After history, exam, and medical workup I feel the patient has been appropriately medically screened and is safe for discharge home. Pertinent diagnoses were discussed with the patient. Patient was given return precautions.  I personally performed the services described in this documentation, which was scribed in my presence. The recorded information has been reviewed and is accurate.   Ethan Hacker, MD 07/29/14 [phone]

## 2014-07-29 MED ORDER — SODIUM CHLORIDE 0.9 % IJ SOLN
INTRAMUSCULAR | Status: AC
  Filled 2014-07-29: qty 1000

## 2014-07-29 NOTE — Discharge Instructions (Signed)
You have painless hematuria which means blood in the urine. Your CT scan was negative for kidney stones. He need to follow-up with urology for further evaluation.  Hematuria Hematuria is blood in your urine. It can be caused by a bladder infection, kidney infection, prostate infection, kidney stone, or cancer of your urinary tract. Infections can usually be treated with medicine, and a kidney stone usually will pass through your urine. If neither of these is the cause of your hematuria, further workup to find out the reason may be needed. It is very important that you tell your health care provider about any blood you see in your urine, even if the blood stops without treatment or happens without causing pain. Blood in your urine that happens and then stops and then happens again can be a symptom of a very serious condition. Also, pain is not a symptom in the initial stages of many urinary cancers. HOME CARE INSTRUCTIONS   Drink lots of fluid, 3-4 quarts a day. If you have been diagnosed with an infection, cranberry juice is especially recommended, in addition to large amounts of water.  Avoid caffeine, tea, and carbonated beverages because they tend to irritate the bladder.  Avoid alcohol because it may irritate the prostate.  Take all medicines as directed by your health care provider.  If you were prescribed an antibiotic medicine, finish it all even if you start to feel better.  If you have been diagnosed with a kidney stone, follow your health care provider's instructions regarding straining your urine to catch the stone.  Empty your bladder often. Avoid holding urine for long periods of time.  After a bowel movement, women should cleanse front to back. Use each tissue only once.  Empty your bladder before and after sexual intercourse if you are a male. SEEK MEDICAL CARE IF:  You develop back pain.  You have a fever.  You have a feeling of sickness in your stomach (nausea) or  vomiting.  Your symptoms are not better in 3 days. Return sooner if you are getting worse. SEEK IMMEDIATE MEDICAL CARE IF:   You develop severe vomiting and are unable to keep the medicine down.  You develop severe back or abdominal pain despite taking your medicines.  You begin passing a large amount of blood or clots in your urine.  You feel extremely weak or faint, or you pass out. MAKE SURE YOU:   Understand these instructions.  Will watch your condition.  Will get help right away if you are not doing well or get worse. Document Released: 02/25/2005 Document Revised: 07/12/2013 Document Reviewed: 10/26/2012 [facility] Patient Information 2015 [place], [place]. This information is not intended to replace advice given to you by your health care provider. Make sure you discuss any questions you have with your health care provider.

## 2016-02-13 ENCOUNTER — Encounter (HOSPITAL_COMMUNITY): Payer: Self-pay

## 2016-02-13 ENCOUNTER — Emergency Department (HOSPITAL_COMMUNITY)
Admission: EM | Admit: 2016-02-13 | Discharge: 2016-02-13 | Disposition: A | Discharge status: Home / Self Care | Payer: BLUE CROSS/BLUE SHIELD | Attending: Emergency Medicine | Admitting: Emergency Medicine

## 2016-02-13 DIAGNOSIS — Y999 Unspecified external cause status: Secondary | ICD-10-CM | POA: Insufficient documentation

## 2016-02-13 DIAGNOSIS — T2692XA Corrosion of left eye and adnexa, part unspecified, initial encounter: Secondary | ICD-10-CM | POA: Insufficient documentation

## 2016-02-13 DIAGNOSIS — Z77098 Contact with and (suspected) exposure to other hazardous, chiefly nonmedicinal, chemicals: Secondary | ICD-10-CM

## 2016-02-13 DIAGNOSIS — Z79899 Other long term (current) drug therapy: Secondary | ICD-10-CM | POA: Insufficient documentation

## 2016-02-13 DIAGNOSIS — Y929 Unspecified place or not applicable: Secondary | ICD-10-CM | POA: Insufficient documentation

## 2016-02-13 DIAGNOSIS — W401XXA Explosion of explosive gases, initial encounter: Secondary | ICD-10-CM | POA: Insufficient documentation

## 2016-02-13 DIAGNOSIS — Y9389 Activity, other specified: Secondary | ICD-10-CM | POA: Insufficient documentation

## 2016-02-13 DIAGNOSIS — T520X1A Toxic effect of petroleum products, accidental (unintentional), initial encounter: Secondary | ICD-10-CM | POA: Insufficient documentation

## 2016-02-13 DIAGNOSIS — T2691XA Corrosion of right eye and adnexa, part unspecified, initial encounter: Secondary | ICD-10-CM | POA: Insufficient documentation

## 2016-02-13 DIAGNOSIS — I10 Essential (primary) hypertension: Secondary | ICD-10-CM | POA: Insufficient documentation

## 2016-02-13 MED ORDER — TETRACAINE HCL 0.5 % OP SOLN
2.0000 [drp] | Freq: Once | OPHTHALMIC | Status: AC
  Administered 2016-02-13: 2 [drp] via OPHTHALMIC
  Filled 2016-02-13: qty 4

## 2016-02-13 MED ORDER — ONDANSETRON 8 MG PO TBDP: 8.0000 mg | ORAL_TABLET | Freq: Three times a day (TID) | ORAL | 0 refills | Status: DC | PRN

## 2016-02-13 MED ORDER — ONDANSETRON 8 MG PO TBDP
8.0000 mg | ORAL_TABLET | Freq: Once | ORAL | Status: AC
  Administered 2016-02-13: 8 mg via ORAL
  Filled 2016-02-13: qty 1

## 2016-02-13 MED ORDER — ACETAMINOPHEN 500 MG PO TABS
1000.0000 mg | ORAL_TABLET | Freq: Once | ORAL | Status: AC
  Administered 2016-02-13: 1000 mg via ORAL
  Filled 2016-02-13: qty 2

## 2016-02-13 MED ORDER — FLUORESCEIN SODIUM 1 MG OP STRP
1.0000 | ORAL_STRIP | Freq: Once | OPHTHALMIC | Status: AC
  Administered 2016-02-13: 1 via OPHTHALMIC
  Filled 2016-02-13: qty 1

## 2016-02-13 NOTE — Discharge Instructions (Signed)
Your exam is reassuring today as discussed.  Call your doctor or return here if you develop any new or worsened symptoms. You may use zofran if your nausea returns.

## 2016-02-13 NOTE — ED Notes (Signed)
Bilateral eyes irrigated at this time

## 2016-02-13 NOTE — ED Notes (Signed)
Updated poison control

## 2016-02-13 NOTE — ED Notes (Signed)
Irrigation completed. Provider notified by Rip Harbour, RN
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
## 2016-04-19 DIAGNOSIS — L723 Sebaceous cyst: Secondary | ICD-10-CM | POA: Diagnosis not present

## 2016-04-19 DIAGNOSIS — Z0001 Encounter for general adult medical examination with abnormal findings: Secondary | ICD-10-CM | POA: Diagnosis not present

## 2016-04-19 DIAGNOSIS — Z6832 Body mass index (BMI) 32.0-32.9, adult: Secondary | ICD-10-CM | POA: Diagnosis not present

## 2016-04-19 DIAGNOSIS — Z1389 Encounter for screening for other disorder: Secondary | ICD-10-CM | POA: Diagnosis not present

## 2016-04-19 DIAGNOSIS — Z0000 Encounter for general adult medical examination without abnormal findings: Secondary | ICD-10-CM | POA: Diagnosis not present

## 2016-04-19 DIAGNOSIS — I10 Essential (primary) hypertension: Secondary | ICD-10-CM | POA: Diagnosis not present

## 2016-05-08 DIAGNOSIS — H5213 Myopia, bilateral: Secondary | ICD-10-CM | POA: Diagnosis not present

## 2017-04-22 DIAGNOSIS — Z6831 Body mass index (BMI) 31.0-31.9, adult: Secondary | ICD-10-CM | POA: Diagnosis not present

## 2017-04-22 DIAGNOSIS — M722 Plantar fascial fibromatosis: Secondary | ICD-10-CM | POA: Diagnosis not present

## 2017-04-22 DIAGNOSIS — G894 Chronic pain syndrome: Secondary | ICD-10-CM | POA: Diagnosis not present

## 2017-04-22 DIAGNOSIS — E6609 Other obesity due to excess calories: Secondary | ICD-10-CM | POA: Diagnosis not present

## 2017-04-22 DIAGNOSIS — Z1389 Encounter for screening for other disorder: Secondary | ICD-10-CM | POA: Diagnosis not present

## 2017-04-22 DIAGNOSIS — Z0000 Encounter for general adult medical examination without abnormal findings: Secondary | ICD-10-CM | POA: Diagnosis not present

## 2017-07-23 DIAGNOSIS — Z6830 Body mass index (BMI) 30.0-30.9, adult: Secondary | ICD-10-CM | POA: Diagnosis not present

## 2017-07-23 DIAGNOSIS — N401 Enlarged prostate with lower urinary tract symptoms: Secondary | ICD-10-CM | POA: Diagnosis not present

## 2017-07-23 DIAGNOSIS — M545 Low back pain: Secondary | ICD-10-CM | POA: Diagnosis not present

## 2017-07-23 DIAGNOSIS — E6609 Other obesity due to excess calories: Secondary | ICD-10-CM | POA: Diagnosis not present

## 2017-07-23 DIAGNOSIS — Z1389 Encounter for screening for other disorder: Secondary | ICD-10-CM | POA: Diagnosis not present

## 2017-07-23 DIAGNOSIS — N400 Enlarged prostate without lower urinary tract symptoms: Secondary | ICD-10-CM | POA: Diagnosis not present

## 2017-11-05 DIAGNOSIS — Z1389 Encounter for screening for other disorder: Secondary | ICD-10-CM | POA: Diagnosis not present

## 2017-11-05 DIAGNOSIS — Z6830 Body mass index (BMI) 30.0-30.9, adult: Secondary | ICD-10-CM | POA: Diagnosis not present

## 2017-11-05 DIAGNOSIS — E6609 Other obesity due to excess calories: Secondary | ICD-10-CM | POA: Diagnosis not present

## 2017-11-05 DIAGNOSIS — G894 Chronic pain syndrome: Secondary | ICD-10-CM | POA: Diagnosis not present

## 2017-11-05 DIAGNOSIS — M549 Dorsalgia, unspecified: Secondary | ICD-10-CM | POA: Diagnosis not present

## 2017-11-05 DIAGNOSIS — F329 Major depressive disorder, single episode, unspecified: Secondary | ICD-10-CM | POA: Diagnosis not present

## 2018-01-01 DIAGNOSIS — M79671 Pain in right foot: Secondary | ICD-10-CM | POA: Diagnosis not present

## 2018-01-01 DIAGNOSIS — B070 Plantar wart: Secondary | ICD-10-CM | POA: Diagnosis not present

## 2018-05-20 DIAGNOSIS — Z1389 Encounter for screening for other disorder: Secondary | ICD-10-CM | POA: Diagnosis not present

## 2018-05-20 DIAGNOSIS — G894 Chronic pain syndrome: Secondary | ICD-10-CM | POA: Diagnosis not present

## 2018-05-20 DIAGNOSIS — E6609 Other obesity due to excess calories: Secondary | ICD-10-CM | POA: Diagnosis not present

## 2018-05-20 DIAGNOSIS — R739 Hyperglycemia, unspecified: Secondary | ICD-10-CM | POA: Diagnosis not present

## 2018-05-20 DIAGNOSIS — Z0000 Encounter for general adult medical examination without abnormal findings: Secondary | ICD-10-CM | POA: Diagnosis not present

## 2018-05-20 DIAGNOSIS — R7309 Other abnormal glucose: Secondary | ICD-10-CM | POA: Diagnosis not present

## 2018-05-20 DIAGNOSIS — Z6831 Body mass index (BMI) 31.0-31.9, adult: Secondary | ICD-10-CM | POA: Diagnosis not present

## 2018-05-20 DIAGNOSIS — R0789 Other chest pain: Secondary | ICD-10-CM | POA: Diagnosis not present

## 2018-05-20 DIAGNOSIS — Z23 Encounter for immunization: Secondary | ICD-10-CM | POA: Diagnosis not present

## 2018-06-24 DIAGNOSIS — R972 Elevated prostate specific antigen [PSA]: Secondary | ICD-10-CM | POA: Diagnosis not present

## 2018-06-24 DIAGNOSIS — N400 Enlarged prostate without lower urinary tract symptoms: Secondary | ICD-10-CM | POA: Diagnosis not present

## 2018-07-01 DIAGNOSIS — Z1389 Encounter for screening for other disorder: Secondary | ICD-10-CM | POA: Diagnosis not present

## 2018-07-01 DIAGNOSIS — R972 Elevated prostate specific antigen [PSA]: Secondary | ICD-10-CM | POA: Diagnosis not present

## 2018-07-01 DIAGNOSIS — I10 Essential (primary) hypertension: Secondary | ICD-10-CM | POA: Diagnosis not present

## 2018-07-01 DIAGNOSIS — E7849 Other hyperlipidemia: Secondary | ICD-10-CM | POA: Diagnosis not present

## 2018-07-01 DIAGNOSIS — E6609 Other obesity due to excess calories: Secondary | ICD-10-CM | POA: Diagnosis not present

## 2018-07-01 DIAGNOSIS — Z6830 Body mass index (BMI) 30.0-30.9, adult: Secondary | ICD-10-CM | POA: Diagnosis not present

## 2018-08-07 DIAGNOSIS — R972 Elevated prostate specific antigen [PSA]: Secondary | ICD-10-CM | POA: Diagnosis not present

## 2018-12-10 ENCOUNTER — Encounter (INDEPENDENT_AMBULATORY_CARE_PROVIDER_SITE_OTHER): Payer: Self-pay | Admitting: *Deleted

## 2019-05-15 DIAGNOSIS — G8929 Other chronic pain: Secondary | ICD-10-CM | POA: Diagnosis not present

## 2019-05-15 DIAGNOSIS — Z809 Family history of malignant neoplasm, unspecified: Secondary | ICD-10-CM | POA: Diagnosis not present

## 2019-05-15 DIAGNOSIS — M1990 Unspecified osteoarthritis, unspecified site: Secondary | ICD-10-CM | POA: Diagnosis not present

## 2019-05-15 DIAGNOSIS — M792 Neuralgia and neuritis, unspecified: Secondary | ICD-10-CM | POA: Diagnosis not present

## 2019-05-15 DIAGNOSIS — G3184 Mild cognitive impairment, so stated: Secondary | ICD-10-CM | POA: Diagnosis not present

## 2019-05-15 DIAGNOSIS — R69 Illness, unspecified: Secondary | ICD-10-CM | POA: Diagnosis not present

## 2019-05-15 DIAGNOSIS — Z7982 Long term (current) use of aspirin: Secondary | ICD-10-CM | POA: Diagnosis not present

## 2019-05-15 DIAGNOSIS — N400 Enlarged prostate without lower urinary tract symptoms: Secondary | ICD-10-CM | POA: Diagnosis not present

## 2019-05-15 DIAGNOSIS — I10 Essential (primary) hypertension: Secondary | ICD-10-CM | POA: Diagnosis not present

## 2019-05-25 DIAGNOSIS — F329 Major depressive disorder, single episode, unspecified: Secondary | ICD-10-CM | POA: Diagnosis not present

## 2019-05-25 DIAGNOSIS — R69 Illness, unspecified: Secondary | ICD-10-CM | POA: Diagnosis not present

## 2019-05-25 DIAGNOSIS — E7849 Other hyperlipidemia: Secondary | ICD-10-CM | POA: Diagnosis not present

## 2019-05-25 DIAGNOSIS — I10 Essential (primary) hypertension: Secondary | ICD-10-CM | POA: Diagnosis not present

## 2019-05-25 DIAGNOSIS — Z1389 Encounter for screening for other disorder: Secondary | ICD-10-CM | POA: Diagnosis not present

## 2019-05-25 DIAGNOSIS — Z0000 Encounter for general adult medical examination without abnormal findings: Secondary | ICD-10-CM | POA: Diagnosis not present

## 2019-05-25 DIAGNOSIS — Z6831 Body mass index (BMI) 31.0-31.9, adult: Secondary | ICD-10-CM | POA: Diagnosis not present

## 2019-05-25 DIAGNOSIS — R7309 Other abnormal glucose: Secondary | ICD-10-CM | POA: Diagnosis not present

## 2019-05-25 DIAGNOSIS — R972 Elevated prostate specific antigen [PSA]: Secondary | ICD-10-CM | POA: Diagnosis not present

## 2019-06-14 DIAGNOSIS — N400 Enlarged prostate without lower urinary tract symptoms: Secondary | ICD-10-CM | POA: Diagnosis not present

## 2019-06-14 DIAGNOSIS — R972 Elevated prostate specific antigen [PSA]: Secondary | ICD-10-CM | POA: Diagnosis not present

## 2019-08-24 DIAGNOSIS — R69 Illness, unspecified: Secondary | ICD-10-CM | POA: Diagnosis not present

## 2019-11-22 DIAGNOSIS — Z6830 Body mass index (BMI) 30.0-30.9, adult: Secondary | ICD-10-CM | POA: Diagnosis not present

## 2019-11-22 DIAGNOSIS — E6609 Other obesity due to excess calories: Secondary | ICD-10-CM | POA: Diagnosis not present

## 2019-11-22 DIAGNOSIS — G894 Chronic pain syndrome: Secondary | ICD-10-CM | POA: Diagnosis not present

## 2019-11-22 DIAGNOSIS — D170 Benign lipomatous neoplasm of skin and subcutaneous tissue of head, face and neck: Secondary | ICD-10-CM | POA: Diagnosis not present

## 2019-12-15 DIAGNOSIS — G8929 Other chronic pain: Secondary | ICD-10-CM | POA: Diagnosis not present

## 2019-12-15 DIAGNOSIS — M549 Dorsalgia, unspecified: Secondary | ICD-10-CM | POA: Diagnosis not present

## 2019-12-15 DIAGNOSIS — I10 Essential (primary) hypertension: Secondary | ICD-10-CM | POA: Diagnosis not present

## 2019-12-15 DIAGNOSIS — R9431 Abnormal electrocardiogram [ECG] [EKG]: Secondary | ICD-10-CM | POA: Diagnosis not present

## 2019-12-15 DIAGNOSIS — R079 Chest pain, unspecified: Secondary | ICD-10-CM | POA: Diagnosis not present

## 2019-12-15 DIAGNOSIS — R748 Abnormal levels of other serum enzymes: Secondary | ICD-10-CM | POA: Diagnosis not present

## 2019-12-15 DIAGNOSIS — R072 Precordial pain: Secondary | ICD-10-CM | POA: Diagnosis not present

## 2019-12-15 DIAGNOSIS — Z20822 Contact with and (suspected) exposure to covid-19: Secondary | ICD-10-CM | POA: Diagnosis not present

## 2019-12-15 DIAGNOSIS — R0602 Shortness of breath: Secondary | ICD-10-CM | POA: Diagnosis not present

## 2019-12-21 DIAGNOSIS — K8050 Calculus of bile duct without cholangitis or cholecystitis without obstruction: Secondary | ICD-10-CM | POA: Diagnosis not present

## 2019-12-21 DIAGNOSIS — Z6830 Body mass index (BMI) 30.0-30.9, adult: Secondary | ICD-10-CM | POA: Diagnosis not present

## 2019-12-21 DIAGNOSIS — E6609 Other obesity due to excess calories: Secondary | ICD-10-CM | POA: Diagnosis not present

## 2020-01-24 DIAGNOSIS — K760 Fatty (change of) liver, not elsewhere classified: Secondary | ICD-10-CM | POA: Diagnosis not present

## 2020-01-24 DIAGNOSIS — Z6830 Body mass index (BMI) 30.0-30.9, adult: Secondary | ICD-10-CM | POA: Diagnosis not present

## 2020-01-24 DIAGNOSIS — K8050 Calculus of bile duct without cholangitis or cholecystitis without obstruction: Secondary | ICD-10-CM | POA: Diagnosis not present

## 2020-02-01 DIAGNOSIS — R948 Abnormal results of function studies of other organs and systems: Secondary | ICD-10-CM | POA: Diagnosis not present

## 2020-02-17 ENCOUNTER — Ambulatory Visit: Payer: Self-pay | Attending: Internal Medicine

## 2020-02-17 DIAGNOSIS — Z23 Encounter for immunization: Secondary | ICD-10-CM

## 2020-02-17 NOTE — Progress Notes (Signed)
   Covid-19 Vaccination Clinic  Name:  Ethan Bentley    MRN: [id number] DOB: 04/09/1955  02/17/2020  [name] was observed post Covid-19 immunization for 15 minutes without incident. He was provided with Vaccine Information Sheet and instruction to access the V-Safe system.   [name] was instructed to call 911 with any severe reactions post vaccine: Marland Kitchen Difficulty breathing  . Swelling of face and throat  . A fast heartbeat  . A bad rash all over body  . Dizziness and weakness   Immunizations Administered    No immunizations on file.

## 2020-03-16 DIAGNOSIS — Z6830 Body mass index (BMI) 30.0-30.9, adult: Secondary | ICD-10-CM | POA: Diagnosis not present

## 2020-03-16 DIAGNOSIS — L729 Follicular cyst of the skin and subcutaneous tissue, unspecified: Secondary | ICD-10-CM | POA: Diagnosis not present

## 2020-03-16 DIAGNOSIS — K8050 Calculus of bile duct without cholangitis or cholecystitis without obstruction: Secondary | ICD-10-CM | POA: Diagnosis not present

## 2020-03-30 ENCOUNTER — Other Ambulatory Visit (HOSPITAL_COMMUNITY): Payer: Self-pay | Admitting: Physician Assistant

## 2020-03-30 DIAGNOSIS — R1084 Generalized abdominal pain: Secondary | ICD-10-CM

## 2020-03-30 DIAGNOSIS — K8050 Calculus of bile duct without cholangitis or cholecystitis without obstruction: Secondary | ICD-10-CM

## 2020-04-05 ENCOUNTER — Encounter (HOSPITAL_COMMUNITY): Payer: Medicare HMO

## 2020-04-06 DIAGNOSIS — U071 COVID-19: Secondary | ICD-10-CM | POA: Diagnosis not present

## 2020-04-12 ENCOUNTER — Encounter (HOSPITAL_COMMUNITY)
Admission: RE | Admit: 2020-04-12 | Discharge: 2020-04-12 | Disposition: A | Discharge status: Home / Self Care | Payer: Medicare HMO | Source: Ambulatory Visit | Attending: Physician Assistant | Admitting: Physician Assistant

## 2020-04-12 ENCOUNTER — Other Ambulatory Visit: Payer: Self-pay

## 2020-04-12 ENCOUNTER — Encounter (HOSPITAL_COMMUNITY): Payer: Self-pay

## 2020-04-12 DIAGNOSIS — K838 Other specified diseases of biliary tract: Secondary | ICD-10-CM | POA: Diagnosis not present

## 2020-04-12 DIAGNOSIS — K8050 Calculus of bile duct without cholangitis or cholecystitis without obstruction: Secondary | ICD-10-CM | POA: Diagnosis not present

## 2020-04-12 DIAGNOSIS — R1084 Generalized abdominal pain: Secondary | ICD-10-CM

## 2020-04-12 DIAGNOSIS — R110 Nausea: Secondary | ICD-10-CM | POA: Diagnosis not present

## 2020-04-12 DIAGNOSIS — R109 Unspecified abdominal pain: Secondary | ICD-10-CM | POA: Diagnosis not present

## 2020-04-12 MED ORDER — TECHNETIUM TC 99M MEBROFENIN IV KIT
5.0000 | PACK | Freq: Once | INTRAVENOUS | Status: AC | PRN
  Administered 2020-04-12: 5.5 via INTRAVENOUS

## 2020-04-26 ENCOUNTER — Encounter (INDEPENDENT_AMBULATORY_CARE_PROVIDER_SITE_OTHER): Payer: Self-pay | Admitting: *Deleted

## 2020-04-27 ENCOUNTER — Emergency Department (HOSPITAL_COMMUNITY)
Admission: EM | Admit: 2020-04-27 | Discharge: 2020-04-28 | Disposition: A | Discharge status: Home / Self Care | Payer: Medicare HMO | Attending: Emergency Medicine | Admitting: Emergency Medicine

## 2020-04-27 ENCOUNTER — Encounter (HOSPITAL_COMMUNITY): Payer: Self-pay

## 2020-04-27 ENCOUNTER — Other Ambulatory Visit: Payer: Self-pay

## 2020-04-27 DIAGNOSIS — R1032 Left lower quadrant pain: Secondary | ICD-10-CM | POA: Diagnosis not present

## 2020-04-27 DIAGNOSIS — K5750 Diverticulosis of both small and large intestine without perforation or abscess without bleeding: Secondary | ICD-10-CM | POA: Diagnosis not present

## 2020-04-27 DIAGNOSIS — M79652 Pain in left thigh: Secondary | ICD-10-CM | POA: Insufficient documentation

## 2020-04-27 DIAGNOSIS — N4889 Other specified disorders of penis: Secondary | ICD-10-CM | POA: Diagnosis not present

## 2020-04-27 DIAGNOSIS — N400 Enlarged prostate without lower urinary tract symptoms: Secondary | ICD-10-CM | POA: Diagnosis not present

## 2020-04-27 DIAGNOSIS — I10 Essential (primary) hypertension: Secondary | ICD-10-CM | POA: Insufficient documentation

## 2020-04-27 DIAGNOSIS — Z79899 Other long term (current) drug therapy: Secondary | ICD-10-CM | POA: Diagnosis not present

## 2020-04-27 DIAGNOSIS — M47819 Spondylosis without myelopathy or radiculopathy, site unspecified: Secondary | ICD-10-CM | POA: Diagnosis not present

## 2020-04-27 DIAGNOSIS — M5442 Lumbago with sciatica, left side: Secondary | ICD-10-CM | POA: Diagnosis not present

## 2020-04-27 NOTE — ED Triage Notes (Signed)
Pt presents to ED with c/o lower left back pain that started about two weeks ago, after doing yard work today, pt reports pain has increased.

## 2020-04-28 ENCOUNTER — Emergency Department (HOSPITAL_COMMUNITY): Payer: Medicare HMO

## 2020-04-28 DIAGNOSIS — M47819 Spondylosis without myelopathy or radiculopathy, site unspecified: Secondary | ICD-10-CM | POA: Diagnosis not present

## 2020-04-28 DIAGNOSIS — N4889 Other specified disorders of penis: Secondary | ICD-10-CM | POA: Diagnosis not present

## 2020-04-28 DIAGNOSIS — K5750 Diverticulosis of both small and large intestine without perforation or abscess without bleeding: Secondary | ICD-10-CM | POA: Diagnosis not present

## 2020-04-28 DIAGNOSIS — N400 Enlarged prostate without lower urinary tract symptoms: Secondary | ICD-10-CM | POA: Diagnosis not present

## 2020-04-28 LAB — URINALYSIS, ROUTINE W REFLEX MICROSCOPIC
Bilirubin Urine: NEGATIVE
Glucose, UA: NEGATIVE mg/dL
Hgb urine dipstick: NEGATIVE
Ketones, ur: NEGATIVE mg/dL
Leukocytes,Ua: NEGATIVE
Nitrite: NEGATIVE
Protein, ur: NEGATIVE mg/dL
Specific Gravity, Urine: 1.025 (ref 1.005–1.030)
pH: 5 (ref 5.0–8.0)

## 2020-04-28 MED ORDER — CYCLOBENZAPRINE HCL 10 MG PO TABS
10.0000 mg | ORAL_TABLET | Freq: Once | ORAL | Status: AC
  Administered 2020-04-28: 10 mg via ORAL
  Filled 2020-04-28: qty 1

## 2020-04-28 MED ORDER — CYCLOBENZAPRINE HCL 10 MG PO TABS: 10.0000 mg | ORAL_TABLET | Freq: Three times a day (TID) | ORAL | 0 refills | Status: DC | PRN

## 2020-04-28 MED ORDER — HYDROCODONE-ACETAMINOPHEN 5-325 MG PO TABS: 1.0000 | ORAL_TABLET | ORAL | 0 refills | Status: DC | PRN

## 2020-04-28 MED ORDER — HYDROCODONE-ACETAMINOPHEN 5-325 MG PO TABS
1.0000 | ORAL_TABLET | Freq: Once | ORAL | Status: AC
  Administered 2020-04-28: 1 via ORAL
  Filled 2020-04-28: qty 1

## 2020-04-28 NOTE — ED Notes (Signed)
Urine sent to the lab  at this time Ethan Bentley

## 2020-04-28 NOTE — ED Provider Notes (Signed)
[place] Provider Note   CSN: [id number] Arrival date & time: 04/27/20  2214   History Chief Complaint  Patient presents with  . Back Pain    Ethan Bentley is a 65 y.o. male.  The history is provided by the patient.  Back Pain He has history of hypertension, back surgery and comes in complaining of pain in the left flank area which started about 3 weeks ago but got significantly worse over the last 24 hours.  He states that he had been doing some work in the yard prior to it getting worse.  He rates pain at 9/10.  Pain radiates to the left lower abdomen as well as to the left thigh.  He has some numbness in his left lower leg related to prior back surgery.  Denies any difficulty urinating and denies any nausea or vomiting or constipation or diarrhea.  He has taken acetaminophen and tramadol without relief.  Pain got worse as he tried to sit up, better if he is laying still.  Past Medical History:  Diagnosis Date  . Arthritis   . Back pain   . Hypertension   . Prostate enlargement     There are no problems to display for this patient.   Past Surgical History:  Procedure Laterality Date  . BACK SURGERY    . COLONOSCOPY N/A 12/02/2013   Procedure: COLONOSCOPY;  Surgeon: Rogene Houston, MD;  Location: [facility];  Service: Endoscopy;  Laterality: N/A;  1030       Family History  Problem Relation Age of Onset  . Cancer Mother   . Stroke Father     Social History   Tobacco Use  . Smoking status: Never Smoker  . Smokeless tobacco: Never Used  Substance Use Topics  . Alcohol use: Yes    Alcohol/week: 3.0 standard drinks    Types: 3 Cans of beer per week    Comment: occ  . Drug use: No    Home Medications Prior to Admission medications   Medication Sig Start Date End Date Taking? Authorizing Provider  Nyoka Cowden Tea, Camillia sinensis, (GREEN TEA PO) Take 1 tablet by mouth daily.    [provider]  ibuprofen (ADVIL,MOTRIN) 200  MG tablet Take 400-600 mg by mouth every 6 (six) hours as needed for mild pain or moderate pain.    [provider]  KRILL OIL PO Take 1 capsule by mouth daily.    [provider]  lisinopril-hydrochlorothiazide (PRINZIDE,ZESTORETIC) 20-12.5 MG per tablet Take 1 tablet by mouth daily.    [provider]  ondansetron (ZOFRAN ODT) 8 MG disintegrating tablet Take 1 tablet (8 mg total) by mouth every 8 (eight) hours as needed for nausea or vomiting. 02/13/16   Evalee Jefferson, PA-C  oxyCODONE-acetaminophen (PERCOCET) 5-325 MG per tablet Take 1-2 tablets by mouth every 4 (four) hours as needed. Patient not taking: Reported on 07/28/2014 04/12/14   Nat Christen, MD    Allergies    Patient has no known allergies.  Review of Systems   Review of Systems  Musculoskeletal: Positive for back pain.  All other systems reviewed and are negative.   Physical Exam Updated Vital Signs BP 133/83 (BP Location: Right Arm)   Pulse 60   Temp 98.1 F (36.7 C) (Oral)   Resp 18   Ht 5\' 6"  (1.676 m)   Wt 83.9 kg   SpO2 98%   BMI 29.86 kg/m   Physical Exam Vitals and nursing note  reviewed.   65 year old male, resting comfortably and in no acute distress. Vital signs are normal. Oxygen saturation is 98%, which is normal. Head is normocephalic and atraumatic. PERRLA, EOMI. Oropharynx is clear. Neck is nontender and supple without adenopathy or JVD. Back is nontender and there is no CVA tenderness.  There is moderate left paralumbar spasm.  Straight leg raise is positive on the left at 60 degrees, negative on the right. Lungs are clear without rales, wheezes, or rhonchi. Chest is nontender. Heart has regular rate and rhythm without murmur. Abdomen is soft, flat, nontender without masses or hepatosplenomegaly and peristalsis is normoactive. Extremities have no cyanosis or edema, full range of motion is present. Skin is warm and dry without rash. Neurologic: Mental status is normal,  cranial nerves are intact, there are no motor or sensory deficits.  ED Results / Procedures / Treatments   Labs (all labs ordered are listed, but only abnormal results are displayed) Labs Reviewed  URINALYSIS, ROUTINE W REFLEX MICROSCOPIC    Radiology CT Renal Stone Study  Result Date: 04/28/2020 CLINICAL DATA:  Flank pain, left.  Suspected kidney stone. EXAM: CT ABDOMEN AND PELVIS WITHOUT CONTRAST TECHNIQUE: Multidetector CT imaging of the abdomen and pelvis was performed following the standard protocol without IV contrast. COMPARISON:  CT renal 07/29/2014 FINDINGS: Lower chest: Right lower lobe atelectasis. Right middle lobe linear atelectasis. No acute abnormality. Hepatobiliary: No focal liver abnormality. No gallstones, gallbladder wall thickening, or pericholecystic fluid. No biliary dilatation. Pancreas: No focal lesion. Normal pancreatic contour. No surrounding inflammatory changes. No main pancreatic ductal dilatation. Spleen: Normal in size without focal abnormality. Adrenals/Urinary Tract: No adrenal nodule bilaterally. No nephrolithiasis, no hydronephrosis, and no contour-deforming renal mass. Redemonstration of a hypodensity within the superior pole of left kidney that is too small to characterize (5:55). No ureterolithiasis or hydroureter. The urinary bladder is unremarkable. Stomach/Bowel: Stomach is within normal limits. No evidence of bowel wall thickening or dilatation. Scattered colonic diverticulosis. Appendix appears normal. Vascular/Lymphatic: No abdominal aorta or iliac aneurysm. Mild atherosclerotic plaque of the aorta and its branches. No abdominal, pelvic, or inguinal lymphadenopathy. Reproductive: The prostate is enlarged measuring up to 5.3 cm. Couple of similar penile calcifications. Other: No intraperitoneal free fluid. No intraperitoneal free gas. No organized fluid collection. Musculoskeletal: No abdominal wall hernia or abnormality. No suspicious lytic or blastic osseous  lesions. No acute displaced fracture. Multilevel degenerative changes of the spine. IMPRESSION: 1. No nephroureterolithiasis bilaterally. 2. Scattered colonic diverticulosis with no acute diverticulitis. 3. Prostatomegaly. 4.  Aortic Atherosclerosis (ICD10-I70.0). Electronically Signed   By: Iven Finn M.D.   On: 04/28/2020 02:14    Procedures Procedures   Medications Ordered in ED Medications  HYDROcodone-acetaminophen (NORCO/VICODIN) 5-325 MG per tablet 1 tablet (has no administration in time range)  cyclobenzaprine (FLEXERIL) tablet 10 mg (has no administration in time range)    ED Course  I have reviewed the triage vital signs and the nursing notes.  Pertinent labs & imaging results that were available during my care of the patient were reviewed by me and considered in my medical decision making (see chart for details).  MDM Rules/Calculators/A&P Left-sided low back pain which seems to be musculoskeletal on exam.  Old records are reviewed showing prior MRI of lumbar spine showing significant disc protrusion, renal stone protocol CT scan in 2016 showing no renal calculi and no aneurysm.  With pain radiating to the abdomen, will check another renal stone protocol CT scan and urinalysis, he will be given  hydrocodone-acetaminophen for pain and a dose of cyclobenzaprine.  Urinalysis is unremarkable.  CT scan showed no evidence of renal or ureteral calculi.  He noted significant improvement with above-noted medications.  He is discharged with a prescription for cyclobenzaprine and a small number of hydrocodone-acetaminophen tablets.  He is to continue taking his previously prescribed tramadol and use over-the-counter NSAIDs.  Follow-up with PCP, return precautions discussed.  Final Clinical Impression(s) / ED Diagnoses Final diagnoses:  Acute left-sided low back pain with left-sided sciatica    Rx / DC Orders ED Discharge Orders         Ordered    HYDROcodone-acetaminophen (NORCO)  5-325 MG tablet  Every 4 hours PRN        04/28/20 0447    cyclobenzaprine (FLEXERIL) 10 MG tablet  3 times daily PRN        04/28/20 1478           Delora Fuel, MD 29/56/21 [phone]

## 2020-04-28 NOTE — Discharge Instructions (Addendum)
Apply ice for 30 minutes at a time, 4 times a day.  You may continue taking ibuprofen or naproxen as needed for pain.  You may continue taking tramadol for pain, but you may use hydrocodone-acetaminophen for those times you need stronger pain medication.

## 2020-05-25 ENCOUNTER — Encounter: Payer: Self-pay | Admitting: *Deleted

## 2020-05-25 DIAGNOSIS — R072 Precordial pain: Secondary | ICD-10-CM | POA: Insufficient documentation

## 2020-05-25 DIAGNOSIS — I10 Essential (primary) hypertension: Secondary | ICD-10-CM | POA: Insufficient documentation

## 2020-05-25 NOTE — Progress Notes (Signed)
Cardiology Office Note   Date:  05/26/2020   ID:  Ethan HUBBERT, DOB 19-Apr-1955, MRN [id number]  PCP:  Ethan Munch, PA-C  Cardiologist:   Minus Breeding, MD Referring:  Ethan Munch, PA-C  Chief Complaint  Patient presents with  . Chest Pain      History of Present Illness: Ethan Bentley is a 65 y.o. male who is referred by Ethan Munch, PA-C for evaluation of chest pain.  The patient has no past cardiac history.  He does have risk factors of hypertension.  He says that for several months he has been getting some chest discomfort.  This seems to happen more with activities such as eating.  He feels like he is short of breath when he is eating and it feels like he had a hard time going down.  He says it might get stuck in his mid chest.  He denies any arm or neck discomfort.  He has not had any new palpitations, presyncope or syncope.  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  He might get a little bit of symptoms with this or some shortness of breath but this is not reproducing the complaints as above.  He does some yard work like Engineer, manufacturing systems and he has not been able to bring on the symptoms although he has had probably a little more dyspnea with exertion or slightly decreased exercise tolerance.  None of this however is profoundly progressive.  He is not describing any resting shortness of breath, PND or orthopnea.  He has some premature ectopic complexes on his EKG but he does not really notice these.  He said he did have a stress test years ago but does not recall why.  He has not had any prior cardiac diagnoses or other work-up.  Interestingly, although he failed to mention this, he was in the emergency room in October at [facility].  He had some chest discomfort at that time and I reviewed these records.  He had negative troponin.  His lipase was mildly elevated however.  Past Medical History:  Diagnosis Date  . Arthritis   . Back  pain   . Hypertension   . Prostate enlargement     Past Surgical History:  Procedure Laterality Date  . BACK SURGERY    . COLONOSCOPY N/A 12/02/2013   Procedure: COLONOSCOPY;  Surgeon: Ethan Houston, MD;  Location: [facility];  Service: Endoscopy;  Laterality: N/A;  1030     Current Outpatient Medications  Medication Sig Dispense Refill  . Ascorbic Acid (VITAMIN C) 1000 MG tablet Take 1,000 mg by mouth daily.    Marland Kitchen aspirin EC 81 MG tablet Take 81 mg by mouth daily. Swallow whole.    . Cholecalciferol (VITAMIN D3 PO) Take by mouth.    Nyoka Cowden Tea, Camillia sinensis, (GREEN TEA PO) Take 1 tablet by mouth daily.    Marland Kitchen lisinopril-hydrochlorothiazide (PRINZIDE,ZESTORETIC) 20-12.5 MG per tablet Take 1 tablet by mouth daily.    . Omega-3 Fatty Acids (OMEGA-3 PO) Take by mouth.    . tamsulosin (FLOMAX) 0.4 MG CAPS capsule Take 0.4 mg by mouth daily.    . traMADol (ULTRAM) 50 MG tablet Take 50 mg by mouth every 6 (six) hours as needed.    . vitamin E 1000 UNIT capsule Take 1,000 Units by mouth daily.     No current facility-administered medications for this visit.    Allergies:  Patient has no known allergies.    Social History:  The patient  reports that he has never smoked. He has never used smokeless tobacco. He reports current alcohol use of about 3.0 standard drinks of alcohol per week. He reports that he does not use drugs.   Family History:  The patient's family history includes Lung cancer in his mother; Stroke in his father.    ROS:  Please see the history of present illness.   Otherwise, review of systems are positive for none.   All other systems are reviewed and negative.    PHYSICAL EXAM: VS:  BP 120/90   Pulse 77   Ht 5\' 6"  (1.676 m)   Wt 193 lb (87.5 kg)   SpO2 97%   BMI 31.15 kg/m  , BMI Body mass index is 31.15 kg/m. GENERAL:  Well appearing HEENT:  Pupils equal round and reactive, fundi not visualized, oral mucosa unremarkable NECK:  No jugular venous  distention, waveform within normal limits, carotid upstroke brisk and symmetric, no bruits, no thyromegaly LYMPHATICS:  No cervical, inguinal adenopathy LUNGS:  Clear to auscultation bilaterally BACK:  No CVA tenderness CHEST:  Unremarkable HEART:  PMI not displaced or sustained,S1 and S2 within normal limits, no S3, no S4, no clicks, no rubs, no murmurs ABD:  Flat, positive bowel sounds normal in frequency in pitch, no bruits, no rebound, no guarding, no midline pulsatile mass, no hepatomegaly, no splenomegaly EXT:  2 plus pulses throughout, no edema, no cyanosis no clubbing SKIN:  No rashes no nodules NEURO:  Cranial nerves II through XII grossly intact, motor grossly intact throughout PSYCH:  Cognitively intact, oriented to person place and time    EKG:  EKG is ordered today. The ekg ordered today demonstrates sinus rhythm, rate 80, axis within normal limits, intervals within normal limits, premature ectopic complexes.   Recent Labs: No results found for requested labs within last 8760 hours.    Lipid Panel No results found for: CHOL, TRIG, HDL, CHOLHDL, VLDL, LDLCALC, LDLDIRECT    Wt Readings from Last 3 Encounters:  05/26/20 193 lb (87.5 kg)  04/27/20 185 lb (83.9 kg)  02/13/16 185 lb (83.9 kg)      Other studies Reviewed: Additional studies/ records that were reviewed today include: Primary care office records, [facility] records Review of the above records demonstrates:  Please see elsewhere in the note.     ASSESSMENT AND PLAN:  PRECORDIAL CHEST PAIN:    Chest pain has nonanginal features but also some features that could suggest angina and he does have hypertension.  I think the pretest probability of coronary artery disease obstructive as the etiology is somewhat low I would like to screen him with a POET (Plain Old Exercise Treadmill).  If this is normal I might suggest a GI evaluation.  HTN: His blood pressure is well controlled.  No change in therapy.  Current  medicines are reviewed at length with the patient today.  The patient does not have concerns regarding medicines.  The following changes have been made:  no change  Labs/ tests ordered today include:   Orders Placed This Encounter  Procedures  . EXERCISE TOLERANCE TEST (ETT)  . EKG 12-Lead     Disposition:   FU with me as needed.     Signed, Minus Breeding, MD  05/26/2020 11:45 AM    [place]

## 2020-05-26 ENCOUNTER — Encounter: Payer: Self-pay | Admitting: Cardiology

## 2020-05-26 ENCOUNTER — Ambulatory Visit: Payer: Medicare HMO | Admitting: Cardiology

## 2020-05-26 ENCOUNTER — Encounter: Payer: Self-pay | Admitting: *Deleted

## 2020-05-26 ENCOUNTER — Telehealth: Payer: Self-pay | Admitting: Cardiology

## 2020-05-26 VITALS — BP 120/90 | HR 77 | Ht 66.0 in | Wt 193.0 lb

## 2020-05-26 DIAGNOSIS — I10 Essential (primary) hypertension: Secondary | ICD-10-CM

## 2020-05-26 DIAGNOSIS — R072 Precordial pain: Secondary | ICD-10-CM | POA: Diagnosis not present

## 2020-05-26 NOTE — Telephone Encounter (Signed)
Pre-cert Verification for the following procedure   GXT  DATE: 06/07/2020  LOCATION: [facility]

## 2020-05-26 NOTE — Patient Instructions (Addendum)
Medication Instructions:  °Your physician recommends that you continue on your current medications as directed. Please refer to the Current Medication list given to you today. ° °Labwork: °none ° °Testing/Procedures: °Your physician has requested that you have an exercise tolerance test. For further information please visit [email or web address]. Please also follow instruction sheet, as given. ° °Follow-Up: °Your physician recommends that you schedule a follow-up appointment in: as needed ° °Any Other Special Instructions Will Be Listed Below (If Applicable). ° °If you need a refill on your cardiac medications before your next appointment, please call your pharmacy. °

## 2020-05-29 DIAGNOSIS — Z6830 Body mass index (BMI) 30.0-30.9, adult: Secondary | ICD-10-CM | POA: Diagnosis not present

## 2020-05-29 DIAGNOSIS — Z0000 Encounter for general adult medical examination without abnormal findings: Secondary | ICD-10-CM | POA: Diagnosis not present

## 2020-05-29 DIAGNOSIS — Z0001 Encounter for general adult medical examination with abnormal findings: Secondary | ICD-10-CM | POA: Diagnosis not present

## 2020-05-29 DIAGNOSIS — R7309 Other abnormal glucose: Secondary | ICD-10-CM | POA: Diagnosis not present

## 2020-05-29 DIAGNOSIS — E6609 Other obesity due to excess calories: Secondary | ICD-10-CM | POA: Diagnosis not present

## 2020-05-29 DIAGNOSIS — Z23 Encounter for immunization: Secondary | ICD-10-CM | POA: Diagnosis not present

## 2020-05-29 DIAGNOSIS — Z125 Encounter for screening for malignant neoplasm of prostate: Secondary | ICD-10-CM | POA: Diagnosis not present

## 2020-05-29 DIAGNOSIS — E782 Mixed hyperlipidemia: Secondary | ICD-10-CM | POA: Diagnosis not present

## 2020-06-05 ENCOUNTER — Other Ambulatory Visit (HOSPITAL_COMMUNITY)
Admission: RE | Admit: 2020-06-05 | Discharge: 2020-06-05 | Disposition: A | Discharge status: Home / Self Care | Payer: Medicare HMO | Source: Ambulatory Visit | Attending: Cardiology | Admitting: Cardiology

## 2020-06-05 ENCOUNTER — Other Ambulatory Visit: Payer: Self-pay

## 2020-06-05 DIAGNOSIS — Z01812 Encounter for preprocedural laboratory examination: Secondary | ICD-10-CM | POA: Diagnosis not present

## 2020-06-05 DIAGNOSIS — Z20822 Contact with and (suspected) exposure to covid-19: Secondary | ICD-10-CM | POA: Diagnosis not present

## 2020-06-05 LAB — SARS CORONAVIRUS 2 (TAT 6-24 HRS): SARS Coronavirus 2: NEGATIVE

## 2020-06-07 ENCOUNTER — Ambulatory Visit (HOSPITAL_COMMUNITY)
Admission: RE | Admit: 2020-06-07 | Discharge: 2020-06-07 | Disposition: A | Discharge status: Home / Self Care | Payer: Medicare HMO | Source: Ambulatory Visit | Attending: Cardiology | Admitting: Cardiology

## 2020-06-07 ENCOUNTER — Other Ambulatory Visit: Payer: Self-pay

## 2020-06-07 DIAGNOSIS — R072 Precordial pain: Secondary | ICD-10-CM | POA: Insufficient documentation

## 2020-06-07 DIAGNOSIS — I10 Essential (primary) hypertension: Secondary | ICD-10-CM | POA: Diagnosis not present

## 2020-06-07 LAB — EXERCISE TOLERANCE TEST
Estimated workload: 8.6 METS
Exercise duration (min): 7 min
Exercise duration (sec): 0 s
MPHR: 154 {beats}/min
Peak HR: 136 {beats}/min
Percent HR: 88 %
RPE: 14
Rest HR: 69 {beats}/min

## 2020-07-03 DIAGNOSIS — J209 Acute bronchitis, unspecified: Secondary | ICD-10-CM | POA: Diagnosis not present

## 2020-07-03 DIAGNOSIS — R0681 Apnea, not elsewhere classified: Secondary | ICD-10-CM | POA: Diagnosis not present

## 2020-07-24 ENCOUNTER — Ambulatory Visit (INDEPENDENT_AMBULATORY_CARE_PROVIDER_SITE_OTHER): Payer: Medicare HMO | Admitting: Gastroenterology

## 2020-09-19 DIAGNOSIS — J301 Allergic rhinitis due to pollen: Secondary | ICD-10-CM | POA: Diagnosis not present

## 2020-09-19 DIAGNOSIS — E6609 Other obesity due to excess calories: Secondary | ICD-10-CM | POA: Diagnosis not present

## 2020-09-19 DIAGNOSIS — Z6831 Body mass index (BMI) 31.0-31.9, adult: Secondary | ICD-10-CM | POA: Diagnosis not present

## 2020-09-19 DIAGNOSIS — R0982 Postnasal drip: Secondary | ICD-10-CM | POA: Diagnosis not present

## 2020-09-28 ENCOUNTER — Ambulatory Visit (INDEPENDENT_AMBULATORY_CARE_PROVIDER_SITE_OTHER): Payer: Medicare HMO | Admitting: Gastroenterology

## 2020-09-28 ENCOUNTER — Encounter (INDEPENDENT_AMBULATORY_CARE_PROVIDER_SITE_OTHER): Payer: Self-pay | Admitting: *Deleted

## 2020-10-16 DIAGNOSIS — J029 Acute pharyngitis, unspecified: Secondary | ICD-10-CM | POA: Diagnosis not present

## 2020-10-16 DIAGNOSIS — J329 Chronic sinusitis, unspecified: Secondary | ICD-10-CM | POA: Diagnosis not present

## 2020-11-21 DIAGNOSIS — N401 Enlarged prostate with lower urinary tract symptoms: Secondary | ICD-10-CM | POA: Diagnosis not present

## 2020-11-21 DIAGNOSIS — Z6831 Body mass index (BMI) 31.0-31.9, adult: Secondary | ICD-10-CM | POA: Diagnosis not present

## 2020-11-21 DIAGNOSIS — E6609 Other obesity due to excess calories: Secondary | ICD-10-CM | POA: Diagnosis not present

## 2020-11-21 DIAGNOSIS — G4709 Other insomnia: Secondary | ICD-10-CM | POA: Diagnosis not present

## 2020-12-25 DIAGNOSIS — R051 Acute cough: Secondary | ICD-10-CM | POA: Diagnosis not present

## 2020-12-25 DIAGNOSIS — J329 Chronic sinusitis, unspecified: Secondary | ICD-10-CM | POA: Diagnosis not present

## 2020-12-27 DIAGNOSIS — J309 Allergic rhinitis, unspecified: Secondary | ICD-10-CM | POA: Diagnosis not present

## 2021-02-06 ENCOUNTER — Encounter: Payer: Self-pay | Admitting: Urology

## 2021-02-06 ENCOUNTER — Ambulatory Visit (INDEPENDENT_AMBULATORY_CARE_PROVIDER_SITE_OTHER): Payer: Medicare HMO | Admitting: Urology

## 2021-02-06 ENCOUNTER — Other Ambulatory Visit: Payer: Self-pay

## 2021-02-06 VITALS — BP 137/81 | HR 67

## 2021-02-06 DIAGNOSIS — N401 Enlarged prostate with lower urinary tract symptoms: Secondary | ICD-10-CM | POA: Diagnosis not present

## 2021-02-06 DIAGNOSIS — R972 Elevated prostate specific antigen [PSA]: Secondary | ICD-10-CM | POA: Diagnosis not present

## 2021-02-06 DIAGNOSIS — N138 Other obstructive and reflux uropathy: Secondary | ICD-10-CM

## 2021-02-06 LAB — URINALYSIS, ROUTINE W REFLEX MICROSCOPIC
Bilirubin, UA: NEGATIVE
Glucose, UA: NEGATIVE
Ketones, UA: NEGATIVE
Leukocytes,UA: NEGATIVE
Nitrite, UA: NEGATIVE
Specific Gravity, UA: >1.03 — ABNORMAL HIGH (ref 1.005–1.030)
Urobilinogen, Ur: 0.2 mg/dL (ref 0.2–1.0)
pH, UA: 5.5 (ref 5.0–7.5)

## 2021-02-06 LAB — MICROSCOPIC EXAMINATION
Renal Epithel, UA: NONE SEEN /hpf
WBC, UA: NONE SEEN /hpf (ref 0–5)

## 2021-02-06 MED ORDER — TADALAFIL 5 MG PO TABS
ORAL_TABLET | ORAL | 11 refills | Status: DC
Start: 2021-02-06 — End: 2023-08-19

## 2021-02-06 NOTE — Progress Notes (Signed)
History of Present Illness: Here for follow-up of elevated PSA.  5.29.2020: TRUS/Bx. Prostate volume 116 mL, PSA 6.3, PSAD 0.05.  Patient has not followed up since that time.  11.29.2022: No recent PSA. He gave "all natural" treatments for BPH which caused his LUTS to worsen. Currently on tamsulosin. IPSS +7  QoL score 2  He does have mild ED. Sildenafil causes HAs.    Past Medical History:  Diagnosis Date   Arthritis    Back pain    Hypertension    Prostate enlargement     Past Surgical History:  Procedure Laterality Date   BACK SURGERY     COLONOSCOPY N/A 12/02/2013   Procedure: COLONOSCOPY;  Surgeon: Rogene Houston, MD;  [facility]: [facility];  Service: Endoscopy;  Laterality: N/A;  1030    Home Medications:  Allergies as of 02/06/2021   No Known Allergies      Medication List        Accurate as of February 06, 2021  1:02 PM. If you have any questions, ask your nurse or doctor.          aspirin EC 81 MG tablet Take 81 mg by mouth daily. Swallow whole.   GREEN TEA PO Take 1 tablet by mouth daily.   lisinopril-hydrochlorothiazide 20-12.5 MG tablet Commonly known as: ZESTORETIC Take 1 tablet by mouth daily.   OMEGA-3 PO Take by mouth.   tamsulosin 0.4 MG Caps capsule Commonly known as: FLOMAX Take 0.4 mg by mouth daily.   traMADol 50 MG tablet Commonly known as: ULTRAM Take 50 mg by mouth every 6 (six) hours as needed.   vitamin C 1000 MG tablet Take 1,000 mg by mouth daily.   VITAMIN D3 PO Take by mouth.   vitamin E 1000 UNIT capsule Take 1,000 Units by mouth daily.        Allergies: No Known Allergies  Family History  Problem Relation Age of Onset   Lung cancer Mother    Stroke Father     Social History:  reports that he has never smoked. He has never used smokeless tobacco. He reports current alcohol use of about 3.0 standard drinks per week. He reports that he does not use drugs.  ROS: A complete review of systems was  performed.  All systems are negative except for pertinent findings as noted.  Physical Exam:  Vital signs in last 24 hours: There were no vitals taken for this visit. Constitutional:  Alert and oriented, No acute distress Cardiovascular: Regular rate  Respiratory: Normal respiratory effort GI: Abdomen is soft, nontender, nondistended, no abdominal masses. No CVAT.  There are no inguinal hernias Genitourinary: Normal male phallus, testes are descended bilaterally and non-tender and without masses, scrotum is normal in appearance without lesions or masses, perineum is normal on inspection.  Normal anal sphincter tone.  Prostate 80 g, symmetric, nonnodular, nontender Lymphatic: No lymphadenopathy Neurologic: Grossly intact, no focal deficits Psychiatric: Normal mood and affect  I have reviewed prior pt notes  I have reviewed urinalysis results  I have independently reviewed prior imaging  I have reviewed prior PSA/pathology results    Impression/Assessment:  1.  BPH, symptoms stable on tamsulosin.  Prostate volume measured at 100 mL at the time of his biopsy in 2020  2.  Elevated PSA with negative biopsy 2 years ago, not followed since then.  Exam today revealed benign but large prostate  3.  ED, sildenafil causes headaches  Plan:  1.  PSA is checked today  2.  I prescribed tadalafil  3.  I will see back in a year for recheck

## 2021-02-06 NOTE — Progress Notes (Signed)
Urological Symptom Review  Patient is experiencing the following symptoms: Painful intercourse   Review of Systems  Gastrointestinal (upper)  : Negative for upper GI symptoms  Gastrointestinal (lower) : Negative for lower GI symptoms  Constitutional : Negative for symptoms  Skin: Negative for skin symptoms  Eyes: Negative for eye symptoms  Ear/Nose/Throat : Negative for Ear/Nose/Throat symptoms  Hematologic/Lymphatic: Negative for Hematologic/Lymphatic symptoms  Cardiovascular : Negative for cardiovascular symptoms  Respiratory : Negative for respiratory symptoms  Endocrine: Negative for endocrine symptoms  Musculoskeletal: Negative for musculoskeletal symptoms  Neurological: Headaches  Psychologic: Negative for psychiatric symptoms

## 2021-02-07 LAB — PSA: Prostate Specific Ag, Serum: 7.1 ng/mL — ABNORMAL HIGH (ref 0.0–4.0)

## 2021-02-12 NOTE — Progress Notes (Signed)
Sent via mail 

## 2021-02-20 ENCOUNTER — Telehealth: Payer: Self-pay

## 2021-02-20 NOTE — Telephone Encounter (Signed)
-----   Message from Franchot Gallo, MD sent at 02/11/2021  9:29 PM EST ----- Notify pt --pt stable--give #--continue yearly followup as planned ----- Message ----- From: Iris Pert, LPN Sent: [id number]  12:07 PM EST To: Franchot Gallo, MD  Please review

## 2021-02-20 NOTE — Telephone Encounter (Signed)
Patient made aware of PSA results.

## 2021-03-15 ENCOUNTER — Telehealth: Payer: Self-pay

## 2021-03-15 NOTE — Telephone Encounter (Signed)
Patient called requesting a note to dismiss him from jury duty due to him constantly having to go to bathroom. Patient states his jury duty starts 03/27/21. Please advise.

## 2021-03-19 NOTE — Telephone Encounter (Signed)
Patient calling to get excuse note from [employer].  Needing it by 03-20-21.  Please call pt back at:  [phone].  Thanks, Helene Kelp

## 2021-03-19 NOTE — Telephone Encounter (Signed)
See below. Please advise.  

## 2021-03-20 NOTE — Telephone Encounter (Signed)
Patient called and notified note left at front desk to pick up. Patient voiced understanding.

## 2021-05-02 ENCOUNTER — Ambulatory Visit (HOSPITAL_COMMUNITY)
Admission: RE | Admit: 2021-05-02 | Discharge: 2021-05-02 | Disposition: A | Discharge status: Home / Self Care | Payer: Medicare HMO | Source: Ambulatory Visit | Attending: Physician Assistant | Admitting: Physician Assistant

## 2021-05-02 ENCOUNTER — Other Ambulatory Visit (HOSPITAL_COMMUNITY): Payer: Self-pay | Admitting: Physician Assistant

## 2021-05-02 ENCOUNTER — Other Ambulatory Visit: Payer: Self-pay

## 2021-05-02 DIAGNOSIS — R918 Other nonspecific abnormal finding of lung field: Secondary | ICD-10-CM | POA: Diagnosis not present

## 2021-05-02 DIAGNOSIS — J45909 Unspecified asthma, uncomplicated: Secondary | ICD-10-CM

## 2021-05-02 DIAGNOSIS — R0781 Pleurodynia: Secondary | ICD-10-CM | POA: Diagnosis not present

## 2021-05-02 DIAGNOSIS — Z6831 Body mass index (BMI) 31.0-31.9, adult: Secondary | ICD-10-CM | POA: Diagnosis not present

## 2021-05-02 DIAGNOSIS — R252 Cramp and spasm: Secondary | ICD-10-CM | POA: Diagnosis not present

## 2021-06-18 ENCOUNTER — Other Ambulatory Visit: Payer: Self-pay | Admitting: Physician Assistant

## 2021-06-18 ENCOUNTER — Other Ambulatory Visit (HOSPITAL_COMMUNITY): Payer: Self-pay | Admitting: Physician Assistant

## 2021-06-18 DIAGNOSIS — R0781 Pleurodynia: Secondary | ICD-10-CM

## 2021-06-18 DIAGNOSIS — R252 Cramp and spasm: Secondary | ICD-10-CM

## 2021-06-18 DIAGNOSIS — J45909 Unspecified asthma, uncomplicated: Secondary | ICD-10-CM

## 2021-06-20 DIAGNOSIS — E119 Type 2 diabetes mellitus without complications: Secondary | ICD-10-CM | POA: Diagnosis not present

## 2021-06-20 DIAGNOSIS — Z1331 Encounter for screening for depression: Secondary | ICD-10-CM | POA: Diagnosis not present

## 2021-06-20 DIAGNOSIS — Z6830 Body mass index (BMI) 30.0-30.9, adult: Secondary | ICD-10-CM | POA: Diagnosis not present

## 2021-06-20 DIAGNOSIS — R7309 Other abnormal glucose: Secondary | ICD-10-CM | POA: Diagnosis not present

## 2021-06-20 DIAGNOSIS — E6609 Other obesity due to excess calories: Secondary | ICD-10-CM | POA: Diagnosis not present

## 2021-06-20 DIAGNOSIS — E782 Mixed hyperlipidemia: Secondary | ICD-10-CM | POA: Diagnosis not present

## 2021-06-20 DIAGNOSIS — Z125 Encounter for screening for malignant neoplasm of prostate: Secondary | ICD-10-CM | POA: Diagnosis not present

## 2021-06-20 DIAGNOSIS — Z0001 Encounter for general adult medical examination with abnormal findings: Secondary | ICD-10-CM | POA: Diagnosis not present

## 2021-06-28 ENCOUNTER — Other Ambulatory Visit: Payer: Self-pay

## 2021-07-09 ENCOUNTER — Ambulatory Visit (HOSPITAL_COMMUNITY)
Admission: RE | Admit: 2021-07-09 | Discharge: 2021-07-09 | Disposition: A | Discharge status: Home / Self Care | Payer: Medicare HMO | Source: Ambulatory Visit | Attending: Physician Assistant | Admitting: Physician Assistant

## 2021-07-09 ENCOUNTER — Encounter (HOSPITAL_COMMUNITY): Payer: Self-pay

## 2021-07-09 ENCOUNTER — Ambulatory Visit (HOSPITAL_COMMUNITY): Payer: Medicare HMO

## 2021-07-09 DIAGNOSIS — J45909 Unspecified asthma, uncomplicated: Secondary | ICD-10-CM | POA: Diagnosis not present

## 2021-07-09 DIAGNOSIS — R0781 Pleurodynia: Secondary | ICD-10-CM | POA: Diagnosis not present

## 2021-07-09 DIAGNOSIS — R918 Other nonspecific abnormal finding of lung field: Secondary | ICD-10-CM | POA: Diagnosis not present

## 2021-07-09 DIAGNOSIS — R252 Cramp and spasm: Secondary | ICD-10-CM | POA: Diagnosis not present

## 2021-07-09 DIAGNOSIS — J9811 Atelectasis: Secondary | ICD-10-CM | POA: Diagnosis not present

## 2021-07-09 DIAGNOSIS — J929 Pleural plaque without asbestos: Secondary | ICD-10-CM | POA: Diagnosis not present

## 2021-08-19 DIAGNOSIS — W57XXXA Bitten or stung by nonvenomous insect and other nonvenomous arthropods, initial encounter: Secondary | ICD-10-CM | POA: Diagnosis not present

## 2021-08-19 DIAGNOSIS — S80862A Insect bite (nonvenomous), left lower leg, initial encounter: Secondary | ICD-10-CM | POA: Diagnosis not present

## 2021-08-19 DIAGNOSIS — S80861A Insect bite (nonvenomous), right lower leg, initial encounter: Secondary | ICD-10-CM | POA: Diagnosis not present

## 2021-08-28 DIAGNOSIS — S30860A Insect bite (nonvenomous) of lower back and pelvis, initial encounter: Secondary | ICD-10-CM | POA: Diagnosis not present

## 2021-09-07 DIAGNOSIS — L989 Disorder of the skin and subcutaneous tissue, unspecified: Secondary | ICD-10-CM | POA: Diagnosis not present

## 2021-09-07 DIAGNOSIS — L720 Epidermal cyst: Secondary | ICD-10-CM | POA: Diagnosis not present

## 2022-01-28 NOTE — Progress Notes (Addendum)
History of Present Illness: Here for follow-up of elevated PSA.  5.29.2020: TRUS/Bx. Prostate volume 116 mL, PSA 6.3, PSAD 0.05. All 12 cores benign.   Patient has not followed up since that time.   11.29.2022: No recent PSA. He gave "all natural" treatments for BPH which caused his LUTS to worsen. Currently on tamsulosin. IPSS +7  QoL score 2   He does have mild ED. Sildenafil causes HAs. Given cialis.  11.21.2023: Here for recheck. Still on tamsulosin. LUTS getting worse.  IPSS 26  QoL score 6.  He did not get his Cialis prescription filled.   Past Medical History:  Diagnosis Date   Arthritis    Back pain    Hypertension    Prostate enlargement     Past Surgical History:  Procedure Laterality Date   BACK SURGERY     COLONOSCOPY N/A 12/02/2013   Procedure: COLONOSCOPY;  Surgeon: Rogene Houston, MD;  [facility];  Service: Endoscopy;  Laterality: N/A;  1030    Home Medications:  Allergies as of 01/29/2022   No Known Allergies      Medication List        Accurate as of January 28, 2022  7:23 PM. If you have any questions, ask your nurse or doctor.          aspirin EC 81 MG tablet Take 81 mg by mouth daily. Swallow whole.   lisinopril-hydrochlorothiazide 20-12.5 MG tablet Commonly known as: ZESTORETIC Take 1 tablet by mouth daily.   OMEGA-3 PO Take by mouth.   tadalafil 5 MG tablet Commonly known as: CIALIS 1/2 to 1 tab po prn   tamsulosin 0.4 MG Caps capsule Commonly known as: FLOMAX Take 0.4 mg by mouth daily.   traMADol 50 MG tablet Commonly known as: ULTRAM Take 50 mg by mouth every 6 (six) hours as needed.   vitamin C 1000 MG tablet Take 1,000 mg by mouth daily.   VITAMIN D3 PO Take by mouth.   vitamin E 1000 UNIT capsule Take 1,000 Units by mouth daily.        Allergies: No Known Allergies  Family History  Problem Relation Age of Onset   Lung cancer Mother    Stroke Father     Social History:  reports that he has  never smoked. He has never used smokeless tobacco. He reports current alcohol use of about 3.0 standard drinks of alcohol per week. He reports that he does not use drugs.  ROS: A complete review of systems was performed.  All systems are negative except for pertinent findings as noted.  Physical Exam:  Vital signs in last 24 hours: There were no vitals taken for this visit. Constitutional:  Alert and oriented, No acute distress Cardiovascular: Regular rate  Respiratory: Normal respiratory effort Neurologic: Grossly intact, no focal deficits Psychiatric: Normal mood and affect  I have reviewed prior pt notes  I have reviewed urinalysis results  I have independently reviewed prior imaging--prostate ultrasound  I have reviewed prior PSA results--PSA last year 7.1  IPSS list reviewed.   Impression/Assessment:  BPH with worsening symptomatology  Elevated PSA with negative biopsy in 2020.  Fairly stable PSA curve  Plan:  1.  I outlined treatment options with him-stay on 1 medication, add finasteride, surgical therapy.  He would like to try the finasteride in addition to the tamsulosin  2.  PSA is checked today  3.  I will have him come back in 3 months to recheck symptoms

## 2022-01-29 ENCOUNTER — Ambulatory Visit (INDEPENDENT_AMBULATORY_CARE_PROVIDER_SITE_OTHER): Payer: Medicare HMO | Admitting: Urology

## 2022-01-29 ENCOUNTER — Encounter: Payer: Self-pay | Admitting: Urology

## 2022-01-29 VITALS — BP 112/79 | HR 93 | Ht 66.0 in | Wt 185.0 lb

## 2022-01-29 DIAGNOSIS — R972 Elevated prostate specific antigen [PSA]: Secondary | ICD-10-CM | POA: Diagnosis not present

## 2022-01-29 DIAGNOSIS — N138 Other obstructive and reflux uropathy: Secondary | ICD-10-CM | POA: Diagnosis not present

## 2022-01-29 DIAGNOSIS — R39198 Other difficulties with micturition: Secondary | ICD-10-CM

## 2022-01-29 DIAGNOSIS — N401 Enlarged prostate with lower urinary tract symptoms: Secondary | ICD-10-CM | POA: Diagnosis not present

## 2022-01-29 MED ORDER — FINASTERIDE 5 MG PO TABS: 5.0000 mg | ORAL_TABLET | Freq: Every day | ORAL | 3 refills | Status: DC

## 2022-01-30 LAB — URINALYSIS, ROUTINE W REFLEX MICROSCOPIC
Bilirubin, UA: NEGATIVE
Glucose, UA: NEGATIVE
Ketones, UA: NEGATIVE
Leukocytes,UA: NEGATIVE
Nitrite, UA: NEGATIVE
Protein,UA: NEGATIVE
Specific Gravity, UA: 1.02 (ref 1.005–1.030)
Urobilinogen, Ur: 1 mg/dL (ref 0.2–1.0)
pH, UA: 5 (ref 5.0–7.5)

## 2022-01-30 LAB — MICROSCOPIC EXAMINATION: Bacteria, UA: NONE SEEN

## 2022-01-30 LAB — PSA: Prostate Specific Ag, Serum: 10.1 ng/mL — ABNORMAL HIGH (ref 0.0–4.0)

## 2022-02-05 ENCOUNTER — Telehealth: Payer: Self-pay

## 2022-02-05 NOTE — Telephone Encounter (Signed)
-----   Message from Franchot Gallo, MD sent at 02/05/2022 12:39 PM EST ----- Let patient know that PSA is up some to 10.  No need to worry about that now-I will probably recheck when I see him in February. ----- Message ----- From: Audie Box, CMA Sent: 01/30/2022   8:18 AM EST To: Franchot Gallo, MD  Please review PSA

## 2022-02-05 NOTE — Telephone Encounter (Signed)
Patient informed of MD response to PSA

## 2022-04-30 ENCOUNTER — Ambulatory Visit: Payer: Medicare HMO | Admitting: Urology

## 2022-05-27 NOTE — Progress Notes (Signed)
History of Present Illness: Ethan Bentley is here for further f/u of symptomatic BPH. At his last visit finasteride was added to his tamsulosin d/t persistent LUTS on monotherapy.    Past Medical History:  Diagnosis Date   Arthritis    Back pain    Hypertension    Prostate enlargement     Past Surgical History:  Procedure Laterality Date   BACK SURGERY     COLONOSCOPY N/A 12/02/2013   Procedure: COLONOSCOPY;  Surgeon: Rogene Houston, MD;  Location: [facility];  Service: Endoscopy;  Laterality: N/A;  1030    Home Medications:  Allergies as of 05/28/2022   No Known Allergies      Medication List        Accurate as of May 27, 2022  8:28 PM. If you have any questions, ask your nurse or doctor.          aspirin EC 81 MG tablet Take 81 mg by mouth daily. Swallow whole.   finasteride 5 MG tablet Commonly known as: PROSCAR Take 1 tablet (5 mg total) by mouth daily.   lisinopril-hydrochlorothiazide 20-12.5 MG tablet Commonly known as: ZESTORETIC Take 1 tablet by mouth daily.   OMEGA-3 PO Take by mouth.   tadalafil 5 MG tablet Commonly known as: CIALIS 1/2 to 1 tab po prn   tamsulosin 0.4 MG Caps capsule Commonly known as: FLOMAX Take 0.4 mg by mouth daily.   traMADol 50 MG tablet Commonly known as: ULTRAM Take 50 mg by mouth every 6 (six) hours as needed.   vitamin C 1000 MG tablet Take 1,000 mg by mouth daily.   VITAMIN D3 PO Take by mouth.   vitamin E 1000 UNIT capsule Take 1,000 Units by mouth daily.        Allergies: No Known Allergies  Family History  Problem Relation Age of Onset   Lung cancer Mother    Stroke Father     Social History:  reports that he has never smoked. He has never used smokeless tobacco. He reports current alcohol use of about 3.0 standard drinks of alcohol per week. He reports that he does not use drugs.  ROS: A complete review of systems was performed.  All systems are negative except for pertinent findings as  noted.  Physical Exam:  Vital signs in last 24 hours: There were no vitals taken for this visit. Constitutional:  Alert and oriented, No acute distress Cardiovascular: Regular rate  Respiratory: Normal respiratory effort GI: Abdomen is soft, nontender, nondistended, no abdominal masses. No CVAT.  Genitourinary: Normal male phallus, testes are descended bilaterally and non-tender and without masses, scrotum is normal in appearance without lesions or masses, perineum is normal on inspection. Lymphatic: No lymphadenopathy Neurologic: Grossly intact, no focal deficits Psychiatric: Normal mood and affect  I have reviewed prior pt notes  I have reviewed notes from referring/previous physicians  I have reviewed urinalysis results  I have independently reviewed prior imaging  I have reviewed prior PSA results   Impression/Assessment:  ***  Plan:  ***

## 2022-05-28 ENCOUNTER — Ambulatory Visit: Payer: Medicare HMO | Admitting: Urology

## 2022-05-28 ENCOUNTER — Encounter: Payer: Self-pay | Admitting: Urology

## 2022-05-28 VITALS — BP 129/78 | HR 77 | Ht 66.0 in | Wt 185.0 lb

## 2022-05-28 DIAGNOSIS — R39198 Other difficulties with micturition: Secondary | ICD-10-CM | POA: Diagnosis not present

## 2022-05-28 DIAGNOSIS — N401 Enlarged prostate with lower urinary tract symptoms: Secondary | ICD-10-CM | POA: Diagnosis not present

## 2022-05-28 DIAGNOSIS — N138 Other obstructive and reflux uropathy: Secondary | ICD-10-CM

## 2022-05-28 DIAGNOSIS — R972 Elevated prostate specific antigen [PSA]: Secondary | ICD-10-CM

## 2022-05-28 LAB — URINALYSIS, ROUTINE W REFLEX MICROSCOPIC
Bilirubin, UA: NEGATIVE
Glucose, UA: NEGATIVE
Ketones, UA: NEGATIVE
Leukocytes,UA: NEGATIVE
Nitrite, UA: NEGATIVE
Protein,UA: NEGATIVE
Specific Gravity, UA: 1.02 (ref 1.005–1.030)
Urobilinogen, Ur: 1 mg/dL (ref 0.2–1.0)
pH, UA: 5.5 (ref 5.0–7.5)

## 2022-05-28 LAB — MICROSCOPIC EXAMINATION: Bacteria, UA: NONE SEEN

## 2022-05-29 LAB — PSA: Prostate Specific Ag, Serum: 10.3 ng/mL — ABNORMAL HIGH (ref 0.0–4.0)

## 2022-05-30 ENCOUNTER — Telehealth: Payer: Self-pay

## 2022-05-30 ENCOUNTER — Other Ambulatory Visit: Payer: Self-pay | Admitting: Urology

## 2022-05-30 DIAGNOSIS — R972 Elevated prostate specific antigen [PSA]: Secondary | ICD-10-CM

## 2022-05-30 NOTE — Telephone Encounter (Signed)
Tried calling patient with no answer, left vm for return call 

## 2022-05-30 NOTE — Telephone Encounter (Signed)
-----   Message from Franchot Gallo, MD sent at 05/30/2022  1:17 PM EDT ----- Please call patient-PSA is up a bit, should be a bit lower on finasteride.  I would recommend that we get an MRI of his prostate just to make sure everything looks okay.  I have put that order in.  It will be done in [place]. ----- Message ----- From: Sherrilyn Rist, CMA Sent: 05/29/2022   2:39 PM EDT To: Franchot Gallo, MD  Please review

## 2022-05-31 NOTE — Telephone Encounter (Signed)
Letter sent out making patient aware.

## 2022-05-31 NOTE — Telephone Encounter (Signed)
-----   Message from Franchot Gallo, MD sent at 05/30/2022  1:17 PM EDT ----- Please call patient-PSA is up a bit, should be a bit lower on finasteride.  I would recommend that we get an MRI of his prostate just to make sure everything looks okay.  I have put that order in.  It will be done in [place]. ----- Message ----- From: Sherrilyn Rist, CMA Sent: 05/29/2022   2:39 PM EDT To: Franchot Gallo, MD  Please review

## 2022-07-15 ENCOUNTER — Ambulatory Visit
Admission: RE | Admit: 2022-07-15 | Discharge: 2022-07-15 | Disposition: A | Discharge status: Home / Self Care | Payer: Medicare HMO | Source: Ambulatory Visit | Attending: Urology | Admitting: Urology

## 2022-07-15 DIAGNOSIS — R972 Elevated prostate specific antigen [PSA]: Secondary | ICD-10-CM

## 2022-07-15 MED ORDER — GADOPICLENOL 0.5 MMOL/ML IV SOLN
9.0000 mL | Freq: Once | INTRAVENOUS | Status: AC | PRN
  Administered 2022-07-15: 9 mL via INTRAVENOUS

## 2022-07-25 ENCOUNTER — Telehealth: Payer: Self-pay

## 2022-07-25 NOTE — Telephone Encounter (Signed)
Patient is aware of Dr. Retta Diones recommendation and voiced understanding

## 2022-07-25 NOTE — Telephone Encounter (Signed)
-----   Message from Marcine Matar, MD sent at 07/25/2022  2:31 PM EDT ----- Please call pt--good news--MRI did not show anything suspicious in prostate--no repeat bx needed. Keep same appt in Sept ----- Message ----- From: Troy Sine, CMA Sent: 07/18/2022   4:50 PM EDT To: Marcine Matar, MD  Please review

## 2022-10-17 ENCOUNTER — Other Ambulatory Visit: Payer: Self-pay | Admitting: Urology

## 2022-10-17 DIAGNOSIS — N138 Other obstructive and reflux uropathy: Secondary | ICD-10-CM

## 2022-10-22 ENCOUNTER — Telehealth: Payer: Self-pay

## 2022-10-22 MED ORDER — TAMSULOSIN HCL 0.4 MG PO CAPS: 0.4000 mg | ORAL_CAPSULE | Freq: Every day | ORAL | 11 refills | Status: DC

## 2022-10-22 NOTE — Telephone Encounter (Signed)
Patient called to ask for flomax refill be sent to [facility] in [place]. Medication sent.

## 2022-11-25 NOTE — Progress Notes (Signed)
History of Present Illness: Here for f/u of BPH w/ LUTS as well as ED  5.29.2020: TRUS/Bx. Prostate volume 116 mL, PSA 6.3, PSAD 0.05. All 12 cores benign.  3.19.2024: PSA 10.3  9.17.2024: He is here today for routine check as well as follow-up with PSA.  He has been on finasteride for about 10 months.  Current IPSS 17/2.  No significant side effects.  Still on tamsulosin as well.  No recent PSA.  Past Medical History:  Diagnosis Date   Arthritis    Back pain    Hypertension    Prostate enlargement     Past Surgical History:  Procedure Laterality Date   BACK SURGERY     COLONOSCOPY N/A 12/02/2013   Procedure: COLONOSCOPY;  Surgeon: Malissa Hippo, MD;  Location: [facility];  Service: Endoscopy;  Laterality: N/A;  1030    Home Medications:  Allergies as of 11/26/2022   No Known Allergies      Medication List        Accurate as of November 25, 2022  9:20 AM. If you have any questions, ask your nurse or doctor.          aspirin EC 81 MG tablet Take 81 mg by mouth daily. Swallow whole.   finasteride 5 MG tablet Commonly known as: PROSCAR TAKE 1 TABLET (5 MG TOTAL) BY MOUTH DAILY.   lisinopril-hydrochlorothiazide 20-12.5 MG tablet Commonly known as: ZESTORETIC Take 1 tablet by mouth daily.   OMEGA-3 PO Take by mouth.   tadalafil 5 MG tablet Commonly known as: CIALIS 1/2 to 1 tab po prn   tamsulosin 0.4 MG Caps capsule Commonly known as: FLOMAX Take 1 capsule (0.4 mg total) by mouth daily.   traMADol 50 MG tablet Commonly known as: ULTRAM Take 50 mg by mouth every 6 (six) hours as needed.   vitamin C 1000 MG tablet Take 1,000 mg by mouth daily.   VITAMIN D3 PO Take by mouth.   vitamin E 1000 UNIT capsule Take 1,000 Units by mouth daily.        Allergies: No Known Allergies  Family History  Problem Relation Age of Onset   Lung cancer Mother    Stroke Father     Social History:  reports that he has never smoked. He has never used  smokeless tobacco. He reports current alcohol use of about 3.0 standard drinks of alcohol per week. He reports that he does not use drugs.  ROS: A complete review of systems was performed.  All systems are negative except for pertinent findings as noted.  Physical Exam:  Vital signs in last 24 hours: There were no vitals taken for this visit. Constitutional:  Alert and oriented, No acute distress Cardiovascular: Regular rate  Respiratory: Normal respiratory effort Neurologic: Grossly intact, no focal deficits Psychiatric: Normal mood and affect  I have reviewed prior pt notes  I have reviewed urinalysis results  I have independently reviewed prior imaging  I have reviewed prior PSA and pathology results  IPSS form reviewed  Bladder scan volume today 45 mL   Impression/Assessment:  1.  BPH, large gland with symptoms, on dual medical therapy  2.  Elevated PSA with negative biopsy about 4 years ago.  At that time, low PSA density  Plan:  1.  For now, I will have him try to wean off the tamsulosin  2.  PSA with reflex to free checked  3.  If appropriate downward trend of PSA, I will have him  come back in 6 months.  If not, consider prostate MRI

## 2022-11-26 ENCOUNTER — Encounter: Payer: Self-pay | Admitting: Urology

## 2022-11-26 ENCOUNTER — Ambulatory Visit: Payer: Medicare HMO | Admitting: Urology

## 2022-11-26 VITALS — BP 106/72 | HR 77

## 2022-11-26 DIAGNOSIS — R972 Elevated prostate specific antigen [PSA]: Secondary | ICD-10-CM

## 2022-11-26 DIAGNOSIS — N138 Other obstructive and reflux uropathy: Secondary | ICD-10-CM

## 2022-11-26 DIAGNOSIS — N401 Enlarged prostate with lower urinary tract symptoms: Secondary | ICD-10-CM | POA: Diagnosis not present

## 2022-11-26 DIAGNOSIS — R39198 Other difficulties with micturition: Secondary | ICD-10-CM | POA: Diagnosis not present

## 2022-11-26 LAB — URINALYSIS, ROUTINE W REFLEX MICROSCOPIC
Bilirubin, UA: NEGATIVE
Glucose, UA: NEGATIVE
Ketones, UA: NEGATIVE
Leukocytes,UA: NEGATIVE
Nitrite, UA: NEGATIVE
Protein,UA: NEGATIVE
Specific Gravity, UA: 1.02 (ref 1.005–1.030)
Urobilinogen, Ur: 0.2 mg/dL (ref 0.2–1.0)
pH, UA: 6 (ref 5.0–7.5)

## 2022-11-26 LAB — MICROSCOPIC EXAMINATION
Bacteria, UA: NONE SEEN
Epithelial Cells (non renal): NONE SEEN /HPF (ref 0–10)
WBC, UA: NONE SEEN /HPF (ref 0–5)

## 2022-11-26 LAB — BLADDER SCAN AMB NON-IMAGING: Scan Result: 45

## 2022-11-28 ENCOUNTER — Telehealth: Payer: Self-pay

## 2022-11-28 NOTE — Telephone Encounter (Signed)
Tried calling patient with no answer, unable to leave vm due to mailbox full.

## 2022-11-28 NOTE — Telephone Encounter (Signed)
-----   Message from Bertram Millard Dahlstedt sent at 11/28/2022  9:01 AM EDT ----- Notify patient-good news PSA down to 6.3.  Follow-up in 6 months as scheduled. ----- Message ----- From: Nell [facility] Results In Sent: 11/26/2022  10:36 AM EDT To: Marcine Matar, MD

## 2022-11-29 NOTE — Telephone Encounter (Signed)
Tried calling patient with no answer, unable to leave vm due to mailbox full. Letter mailed out

## 2023-01-22 IMAGING — NM NM HEPATO W/GB/PHARM/[PERSON_NAME]
2 series · 12 of 12 positions shown · non-contrast
Comparison: None

CLINICAL DATA: One year history of RIGHT-side abdominal pain
radiating to back worsened with food, some nausea without vomiting

EXAM:
NUCLEAR MEDICINE HEPATOBILIARY IMAGING WITH GALLBLADDER EF
TECHNIQUE: Sequential images of the abdomen were obtained [DATE] minutes
following intravenous administration of radiopharmaceutical. After
oral ingestion of Ensure, gallbladder ejection fraction was
determined. At 60 min, normal ejection fraction is greater than 33%.
RADIOPHARMACEUTICALS:  5.5 mCi Jc-11m  Choletec IV

[Series 1: biliary · 3.25mm/px · 6 of 35 frames shown]
[frame 3/35]
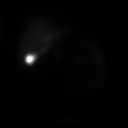
[frame 9/35]
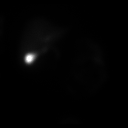
[frame 15/35]
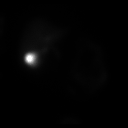
[frame 21/35]
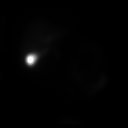
[frame 27/35]
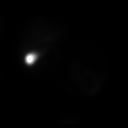
[frame 33/35]
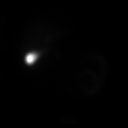

[Series 2: gbef · 3.25mm/px · 6 of 60 frames shown]
[frame 6/60]
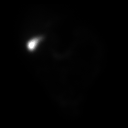
[frame 16/60]
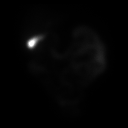
[frame 26/60]
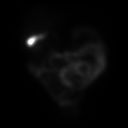
[frame 36/60]
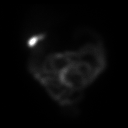
[frame 46/60]
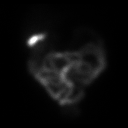
[frame 56/60]
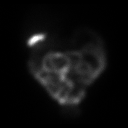

[12 of 12 positions shown; findings below may reference images not displayed]

FINDINGS: Normal tracer extraction from bloodstream indicating normal
hepatocellular function.

Normal excretion of tracer into biliary tree.

Gallbladder and small bowel both visualize normally indicating
patency of common bile duct and cystic duct.

No hepatic retention of tracer.

Subjectively normal emptying of tracer from gallbladder following
fatty meal stimulation.

Calculated gallbladder ejection fraction is 80%, normal.

Patient reported some back pain following Ensure ingestion.

Normal gallbladder ejection fraction following Ensure ingestion is
greater than 33% at 1 hour.
IMPRESSION: Patent biliary tree.

Normal gallbladder ejection fraction of 80% following fatty meal
stimulation.

Patient reported some back pain following Ensure.

## 2023-06-02 NOTE — Progress Notes (Incomplete)
 History of Present Illness: Here for f/u of BPH w/ LUTS as well as ED  5.29.2020: TRUS/Bx. Prostate volume 116 mL, PSA 6.3, PSAD 0.05. All 12 cores benign.  3.19.2024: PSA 10.3  9.17.2024: PSA 6.3  Past Medical History:  Diagnosis Date   Arthritis    Back pain    Hypertension    Prostate enlargement     Past Surgical History:  Procedure Laterality Date   BACK SURGERY     COLONOSCOPY N/A 12/02/2013   Procedure: COLONOSCOPY;  Surgeon: Malissa Hippo, MD;  Location: [facility];  Service: Endoscopy;  Laterality: N/A;  1030    Home Medications:  Allergies as of 06/03/2023   No Known Allergies      Medication List        Accurate as of June 02, 2023 12:02 PM. If you have any questions, ask your nurse or doctor.          aspirin EC 81 MG tablet Take 81 mg by mouth daily. Swallow whole.   finasteride 5 MG tablet Commonly known as: PROSCAR TAKE 1 TABLET (5 MG TOTAL) BY MOUTH DAILY.   lisinopril-hydrochlorothiazide 20-12.5 MG tablet Commonly known as: ZESTORETIC Take 1 tablet by mouth daily.   OMEGA-3 PO Take by mouth.   tadalafil 5 MG tablet Commonly known as: CIALIS 1/2 to 1 tab po prn   tamsulosin 0.4 MG Caps capsule Commonly known as: FLOMAX Take 1 capsule (0.4 mg total) by mouth daily.   traMADol 50 MG tablet Commonly known as: ULTRAM Take 50 mg by mouth every 6 (six) hours as needed.   vitamin C 1000 MG tablet Take 1,000 mg by mouth daily.   VITAMIN D3 PO Take by mouth.   vitamin E 1000 UNIT capsule Take 1,000 Units by mouth daily.        Allergies: No Known Allergies  Family History  Problem Relation Age of Onset   Lung cancer Mother    Stroke Father     Social History:  reports that he has never smoked. He has never used smokeless tobacco. He reports current alcohol use of about 3.0 standard drinks of alcohol per week. He reports that he does not use drugs.  ROS: A complete review of systems was performed.  All systems are  negative except for pertinent findings as noted.  Physical Exam:  Vital signs in last 24 hours: There were no vitals taken for this visit. Constitutional:  Alert and oriented, No acute distress Cardiovascular: Regular rate  Respiratory: Normal respiratory effort Neurologic: Grossly intact, no focal deficits Psychiatric: Normal mood and affect  I have reviewed prior pt notes  I have reviewed urinalysis results  I have independently reviewed prior imaging  I have reviewed prior PSA and pathology results  IPSS form reviewed  Bladder scan volume today 45 mL   Impression/Assessment:  1.  BPH, large gland with symptoms, on dual medical therapy  2.  Elevated PSA with negative biopsy about 4 years ago.  At that time, low PSA density  Plan:

## 2023-06-03 ENCOUNTER — Ambulatory Visit: Payer: Medicare HMO | Admitting: Urology

## 2023-06-03 DIAGNOSIS — R39198 Other difficulties with micturition: Secondary | ICD-10-CM

## 2023-06-03 DIAGNOSIS — N401 Enlarged prostate with lower urinary tract symptoms: Secondary | ICD-10-CM

## 2023-06-03 DIAGNOSIS — R972 Elevated prostate specific antigen [PSA]: Secondary | ICD-10-CM

## 2023-07-08 ENCOUNTER — Ambulatory Visit: Admitting: Urology

## 2023-08-12 ENCOUNTER — Ambulatory Visit: Admitting: Urology

## 2023-08-18 NOTE — Progress Notes (Signed)
 Impression/Assessment:  1.  BPH, large gland with symptoms, on dual medical therapy  2.  Elevated PSA with negative biopsy about 4 years ago.  At that time, low PSA density.  Prostate exam today benign but large  3.  ED, he does not have normal dose Cialis   Plan:  1.  PSA was checked today  2.  New prescription for Cialis  20 mg sent in  3.  I will have him come back in a year unless PSA trend abnormal  History of Present Illness: Here for f/u of BPH w/ LUTS as well as ED  5.29.2020: TRUS/Bx. Prostate volume 116 mL, PSA 6.3, PSAD 0.05. All 12 cores benign.  3.19.2024: PSA 10.3  9.17.2024: He is here today for routine check as well as follow-up with PSA.  He has been on finasteride  for about 10 months.  Current IPSS 17/2.  No significant side effects.  Still on tamsulosin  as well.  No recent PSA.  6.10.2025: He is off of tamsulosin , on finasteride .  Currently, he does not complain about significant voiding symptoms.  IPSS 12/3.  Residual urine volume today 3 mL.  He does complain of ED. he has a prescription for low-dose Cialis .  Past Medical History:  Diagnosis Date   Arthritis    Back pain    Hypertension    Prostate enlargement     Past Surgical History:  Procedure Laterality Date   BACK SURGERY     COLONOSCOPY N/A 12/02/2013   Procedure: COLONOSCOPY;  Surgeon: Ruby Corporal, MD;  Location: [facility];  Service: Endoscopy;  Laterality: N/A;  1030    Home Medications:  Allergies as of 08/19/2023   No Known Allergies      Medication List        Accurate as of August 18, 2023  8:24 AM. If you have any questions, ask your nurse or doctor.          aspirin EC 81 MG tablet Take 81 mg by mouth daily. Swallow whole.   finasteride  5 MG tablet Commonly known as: PROSCAR  TAKE 1 TABLET (5 MG TOTAL) BY MOUTH DAILY.   lisinopril-hydrochlorothiazide 20-12.5 MG tablet Commonly known as: ZESTORETIC Take 1 tablet by mouth daily.   OMEGA-3 PO Take by mouth.    tadalafil  5 MG tablet Commonly known as: CIALIS  1/2 to 1 tab po prn   tamsulosin  0.4 MG Caps capsule Commonly known as: FLOMAX  Take 1 capsule (0.4 mg total) by mouth daily.   traMADol 50 MG tablet Commonly known as: ULTRAM Take 50 mg by mouth every 6 (six) hours as needed.   vitamin C 1000 MG tablet Take 1,000 mg by mouth daily.   VITAMIN D3 PO Take by mouth.   vitamin E 1000 UNIT capsule Take 1,000 Units by mouth daily.        Allergies: No Known Allergies  Family History  Problem Relation Age of Onset   Lung cancer Mother    Stroke Father     Social History:  reports that he has never smoked. He has never used smokeless tobacco. He reports current alcohol use of about 3.0 standard drinks of alcohol per week. He reports that he does not use drugs.  ROS: A complete review of systems was performed.  All systems are negative except for pertinent findings as noted.  Physical Exam:  Vital signs in last 24 hours: There were no vitals taken for this visit. Constitutional:  Alert and oriented, No acute distress Cardiovascular: Regular rate  Respiratory: Normal respiratory effort GU: Normal anal sphincter tone.  Gland 80 g, symmetric, nonnodular, nontender. Neurologic: Grossly intact, no focal deficits Psychiatric: Normal mood and affect  I have reviewed prior pt notes  I have reviewed urinalysis results  I have independently reviewed prior imaging  I have reviewed prior PSA and pathology results  IPSS form reviewed  Bladder scan volume 3 mL   I

## 2023-08-19 ENCOUNTER — Ambulatory Visit: Admitting: Urology

## 2023-08-19 VITALS — BP 132/79 | HR 71

## 2023-08-19 DIAGNOSIS — R972 Elevated prostate specific antigen [PSA]: Secondary | ICD-10-CM | POA: Diagnosis not present

## 2023-08-19 DIAGNOSIS — N138 Other obstructive and reflux uropathy: Secondary | ICD-10-CM

## 2023-08-19 DIAGNOSIS — R39198 Other difficulties with micturition: Secondary | ICD-10-CM

## 2023-08-19 DIAGNOSIS — N401 Enlarged prostate with lower urinary tract symptoms: Secondary | ICD-10-CM

## 2023-08-19 DIAGNOSIS — N5201 Erectile dysfunction due to arterial insufficiency: Secondary | ICD-10-CM | POA: Diagnosis not present

## 2023-08-19 LAB — URINALYSIS, ROUTINE W REFLEX MICROSCOPIC
Bilirubin, UA: NEGATIVE
Glucose, UA: NEGATIVE
Ketones, UA: NEGATIVE
Leukocytes,UA: NEGATIVE
Nitrite, UA: NEGATIVE
Protein,UA: NEGATIVE
Specific Gravity, UA: 1.02 (ref 1.005–1.030)
Urobilinogen, Ur: 1 mg/dL (ref 0.2–1.0)
pH, UA: 6.5 (ref 5.0–7.5)

## 2023-08-19 LAB — MICROSCOPIC EXAMINATION: Bacteria, UA: NONE SEEN

## 2023-08-19 LAB — BLADDER SCAN AMB NON-IMAGING: Scan Result: 3

## 2023-08-19 MED ORDER — TADALAFIL 20 MG PO TABS
ORAL_TABLET | ORAL | 11 refills | Status: AC
Start: 2023-08-19 — End: ?

## 2023-08-19 NOTE — Progress Notes (Signed)
 Bladder Scan completed today.  Patient can void prior to the bladder scan. Bladder scan result: 3  Performed By: Alfonse Spruce. CMA  Additional notes-

## 2023-08-20 LAB — PSA: Prostate Specific Ag, Serum: 5.9 ng/mL — ABNORMAL HIGH (ref 0.0–4.0)

## 2023-08-22 ENCOUNTER — Ambulatory Visit: Payer: Self-pay

## 2023-08-22 NOTE — Telephone Encounter (Signed)
-----   Message from Malcolm Scrivener Dahlstedt sent at 08/22/2023 11:34 AM EDT ----- Ethan Bentley pt--=psa lower @ 5.9 ----- Message ----- From: [facility] Results In Sent: 08/19/2023   3:36 PM EDT To: Trent Frizzle, MD

## 2023-08-22 NOTE — Telephone Encounter (Signed)
 Patient is made aware and voiced understanding.

## 2023-10-29 ENCOUNTER — Other Ambulatory Visit: Payer: Self-pay | Admitting: Urology

## 2023-10-29 DIAGNOSIS — N401 Enlarged prostate with lower urinary tract symptoms: Secondary | ICD-10-CM

## 2023-11-03 ENCOUNTER — Other Ambulatory Visit: Payer: Self-pay

## 2023-11-03 NOTE — Telephone Encounter (Signed)
 Opened in error

## 2024-08-24 ENCOUNTER — Ambulatory Visit: Admitting: Urology
